# Patient Record
Sex: Female | Born: 1940 | Race: Black or African American | Hispanic: No | Smoking: Former smoker
Health system: Southern US, Community
[De-identification: ages and names within clinical notes are randomized; demographics above are authoritative.]

## PROBLEM LIST (undated history)

## (undated) DIAGNOSIS — R2681 Unsteadiness on feet: Secondary | ICD-10-CM

## (undated) DIAGNOSIS — E46 Unspecified protein-calorie malnutrition: Secondary | ICD-10-CM

## (undated) DIAGNOSIS — M8580 Other specified disorders of bone density and structure, unspecified site: Secondary | ICD-10-CM

## (undated) DIAGNOSIS — M1711 Unilateral primary osteoarthritis, right knee: Secondary | ICD-10-CM

## (undated) DIAGNOSIS — E119 Type 2 diabetes mellitus without complications: Secondary | ICD-10-CM

## (undated) DIAGNOSIS — Z87891 Personal history of nicotine dependence: Secondary | ICD-10-CM

## (undated) DIAGNOSIS — R4189 Other symptoms and signs involving cognitive functions and awareness: Secondary | ICD-10-CM

## (undated) DIAGNOSIS — F0390 Unspecified dementia without behavioral disturbance: Secondary | ICD-10-CM

## (undated) DIAGNOSIS — M1990 Unspecified osteoarthritis, unspecified site: Secondary | ICD-10-CM

## (undated) DIAGNOSIS — K5790 Diverticulosis of intestine, part unspecified, without perforation or abscess without bleeding: Secondary | ICD-10-CM

## (undated) DIAGNOSIS — E7800 Pure hypercholesterolemia, unspecified: Secondary | ICD-10-CM

## (undated) DIAGNOSIS — K219 Gastro-esophageal reflux disease without esophagitis: Secondary | ICD-10-CM

## (undated) HISTORY — DX: Other symptoms and signs involving cognitive functions and awareness: R41.89

## (undated) HISTORY — DX: Unsteadiness on feet: R26.81

## (undated) HISTORY — DX: Unspecified protein-calorie malnutrition: E46

## (undated) HISTORY — PX: OTHER SURGICAL HISTORY: SHX169

## (undated) HISTORY — DX: Gastro-esophageal reflux disease without esophagitis: K21.9

## (undated) HISTORY — DX: Type 2 diabetes mellitus without complications: E11.9

## (undated) HISTORY — DX: Unspecified osteoarthritis, unspecified site: M19.90

## (undated) HISTORY — DX: Unilateral primary osteoarthritis, right knee: M17.11

## (undated) HISTORY — DX: Personal history of nicotine dependence: Z87.891

## (undated) HISTORY — DX: Other specified disorders of bone density and structure, unspecified site: M85.80

## (undated) HISTORY — DX: Unspecified dementia, unspecified severity, without behavioral disturbance, psychotic disturbance, mood disturbance, and anxiety: F03.90

## (undated) HISTORY — DX: Pure hypercholesterolemia, unspecified: E78.00

## (undated) HISTORY — DX: Diverticulosis of intestine, part unspecified, without perforation or abscess without bleeding: K57.90

---

## 1998-02-03 ENCOUNTER — Ambulatory Visit (HOSPITAL_COMMUNITY): Admission: RE | Admit: 1998-02-03 | Discharge: 1998-02-03 | Payer: Self-pay | Admitting: Internal Medicine

## 1998-03-19 ENCOUNTER — Other Ambulatory Visit: Admission: RE | Admit: 1998-03-19 | Discharge: 1998-03-19 | Payer: Self-pay | Admitting: Internal Medicine

## 1998-05-07 ENCOUNTER — Ambulatory Visit (HOSPITAL_COMMUNITY): Admission: RE | Admit: 1998-05-07 | Discharge: 1998-05-07 | Payer: Self-pay | Admitting: Neurosurgery

## 1998-10-02 ENCOUNTER — Emergency Department (HOSPITAL_COMMUNITY): Admission: EM | Admit: 1998-10-02 | Discharge: 1998-10-02 | Payer: Self-pay | Admitting: Emergency Medicine

## 1998-11-16 ENCOUNTER — Emergency Department (HOSPITAL_COMMUNITY): Admission: EM | Admit: 1998-11-16 | Discharge: 1998-11-16 | Payer: Self-pay | Admitting: Emergency Medicine

## 1998-11-17 ENCOUNTER — Encounter: Payer: Self-pay | Admitting: *Deleted

## 1999-11-25 ENCOUNTER — Encounter: Admission: RE | Admit: 1999-11-25 | Discharge: 1999-11-25 | Payer: Self-pay | Admitting: *Deleted

## 1999-11-25 ENCOUNTER — Encounter: Payer: Self-pay | Admitting: Internal Medicine

## 2000-01-08 ENCOUNTER — Ambulatory Visit (HOSPITAL_COMMUNITY): Admission: RE | Admit: 2000-01-08 | Discharge: 2000-01-08 | Payer: Self-pay | Admitting: Internal Medicine

## 2000-01-08 ENCOUNTER — Encounter: Payer: Self-pay | Admitting: Internal Medicine

## 2002-05-25 ENCOUNTER — Encounter: Payer: Self-pay | Admitting: Neurosurgery

## 2002-05-25 ENCOUNTER — Ambulatory Visit (HOSPITAL_COMMUNITY): Admission: RE | Admit: 2002-05-25 | Discharge: 2002-05-25 | Payer: Self-pay | Admitting: Neurosurgery

## 2002-06-06 ENCOUNTER — Encounter: Admission: RE | Admit: 2002-06-06 | Discharge: 2002-09-04 | Payer: Self-pay | Admitting: Orthopedic Surgery

## 2002-09-05 ENCOUNTER — Encounter (HOSPITAL_COMMUNITY): Admission: RE | Admit: 2002-09-05 | Discharge: 2002-09-06 | Payer: Self-pay | Admitting: Orthopedic Surgery

## 2002-09-05 ENCOUNTER — Encounter: Admission: RE | Admit: 2002-09-05 | Discharge: 2002-12-04 | Payer: Self-pay | Admitting: Orthopedic Surgery

## 2002-10-11 ENCOUNTER — Encounter: Admission: RE | Admit: 2002-10-11 | Discharge: 2002-10-11 | Payer: Self-pay | Admitting: Internal Medicine

## 2002-10-11 ENCOUNTER — Encounter: Payer: Self-pay | Admitting: Internal Medicine

## 2003-12-13 ENCOUNTER — Emergency Department (HOSPITAL_COMMUNITY): Admission: AD | Admit: 2003-12-13 | Discharge: 2003-12-13 | Payer: Self-pay | Admitting: Family Medicine

## 2004-01-06 ENCOUNTER — Emergency Department (HOSPITAL_COMMUNITY): Admission: AD | Admit: 2004-01-06 | Discharge: 2004-01-06 | Payer: Self-pay | Admitting: Family Medicine

## 2005-03-10 ENCOUNTER — Emergency Department (HOSPITAL_COMMUNITY): Admission: EM | Admit: 2005-03-10 | Discharge: 2005-03-10 | Payer: Self-pay | Admitting: Emergency Medicine

## 2005-04-05 ENCOUNTER — Encounter: Admission: RE | Admit: 2005-04-05 | Discharge: 2005-04-05 | Payer: Self-pay | Admitting: Neurosurgery

## 2005-09-04 ENCOUNTER — Emergency Department (HOSPITAL_COMMUNITY): Admission: EM | Admit: 2005-09-04 | Discharge: 2005-09-04 | Payer: Self-pay | Admitting: Family Medicine

## 2006-06-22 ENCOUNTER — Encounter: Admission: RE | Admit: 2006-06-22 | Discharge: 2006-06-22 | Payer: Self-pay | Admitting: Orthopedic Surgery

## 2006-07-07 ENCOUNTER — Encounter: Admission: RE | Admit: 2006-07-07 | Discharge: 2006-07-07 | Payer: Self-pay | Admitting: Orthopedic Surgery

## 2006-08-09 ENCOUNTER — Ambulatory Visit (HOSPITAL_COMMUNITY): Admission: RE | Admit: 2006-08-09 | Discharge: 2006-08-10 | Payer: Self-pay | Admitting: Orthopedic Surgery

## 2006-09-14 ENCOUNTER — Encounter: Admission: RE | Admit: 2006-09-14 | Discharge: 2006-10-05 | Payer: Self-pay | Admitting: Orthopedic Surgery

## 2006-10-06 ENCOUNTER — Encounter: Admission: RE | Admit: 2006-10-06 | Discharge: 2006-11-02 | Payer: Self-pay | Admitting: Orthopedic Surgery

## 2006-10-07 ENCOUNTER — Encounter: Admission: RE | Admit: 2006-10-07 | Discharge: 2006-10-07 | Payer: Self-pay | Admitting: Orthopedic Surgery

## 2006-11-03 ENCOUNTER — Encounter: Admission: RE | Admit: 2006-11-03 | Discharge: 2007-01-02 | Payer: Self-pay | Admitting: Orthopedic Surgery

## 2007-01-17 ENCOUNTER — Ambulatory Visit (HOSPITAL_COMMUNITY): Admission: RE | Admit: 2007-01-17 | Discharge: 2007-01-18 | Payer: Self-pay | Admitting: Orthopedic Surgery

## 2007-02-16 ENCOUNTER — Encounter: Admission: RE | Admit: 2007-02-16 | Discharge: 2007-03-09 | Payer: Self-pay | Admitting: Orthopedic Surgery

## 2007-03-10 ENCOUNTER — Encounter: Admission: RE | Admit: 2007-03-10 | Discharge: 2007-03-30 | Payer: Self-pay | Admitting: Orthopedic Surgery

## 2007-05-08 ENCOUNTER — Emergency Department (HOSPITAL_COMMUNITY): Admission: EM | Admit: 2007-05-08 | Discharge: 2007-05-08 | Payer: Self-pay | Admitting: Family Medicine

## 2007-10-10 ENCOUNTER — Encounter: Admission: RE | Admit: 2007-10-10 | Discharge: 2007-10-10 | Payer: Self-pay | Admitting: Orthopedic Surgery

## 2007-11-01 ENCOUNTER — Encounter: Admission: RE | Admit: 2007-11-01 | Discharge: 2007-11-01 | Payer: Self-pay | Admitting: Orthopedic Surgery

## 2008-06-05 ENCOUNTER — Encounter: Admission: RE | Admit: 2008-06-05 | Discharge: 2008-06-05 | Payer: Self-pay | Admitting: Orthopedic Surgery

## 2008-06-12 ENCOUNTER — Ambulatory Visit (HOSPITAL_COMMUNITY): Admission: RE | Admit: 2008-06-12 | Discharge: 2008-06-12 | Payer: Self-pay | Admitting: Internal Medicine

## 2008-06-26 ENCOUNTER — Encounter: Admission: RE | Admit: 2008-06-26 | Discharge: 2008-07-24 | Payer: Self-pay | Admitting: Orthopedic Surgery

## 2009-04-23 IMAGING — CR DG WRIST COMPLETE 3+V*R*
2 series · 2 of 2 positions shown · non-contrast
Comparison: none

Addendum BeginsOriginal report by Dr. Jae-Yong Jerabkova.  Following addendum by Dr. Jae-Yong Jerabkova on 10/10/07.
 Comparison from 01/06/04 demonstrates degenerative changes between the capitellum and the scaphoid are similar. 

 Addendum Ends
CLINICAL DATA: 66 year old with right wrist pain and swelling. 
 RIGHT WRIST - 4 VIEW:

[view not recorded (1 of 2)]
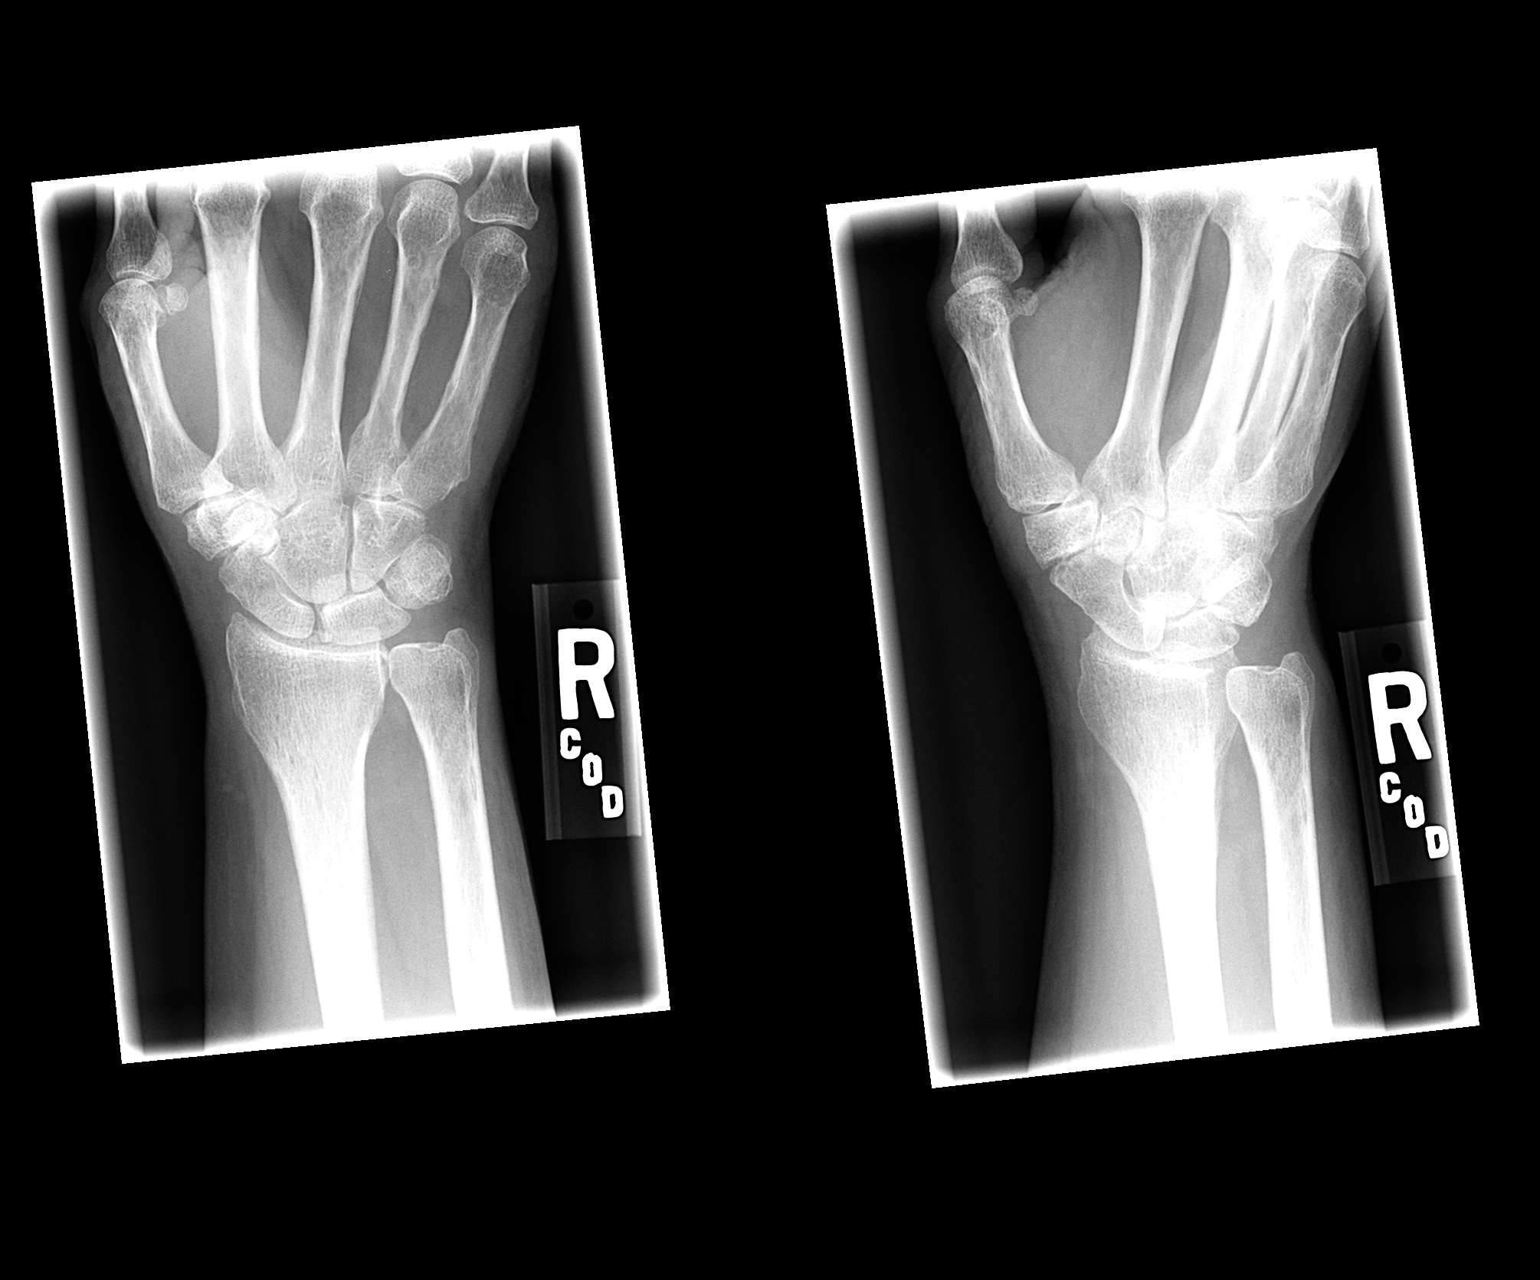

[view not recorded (2 of 2)]
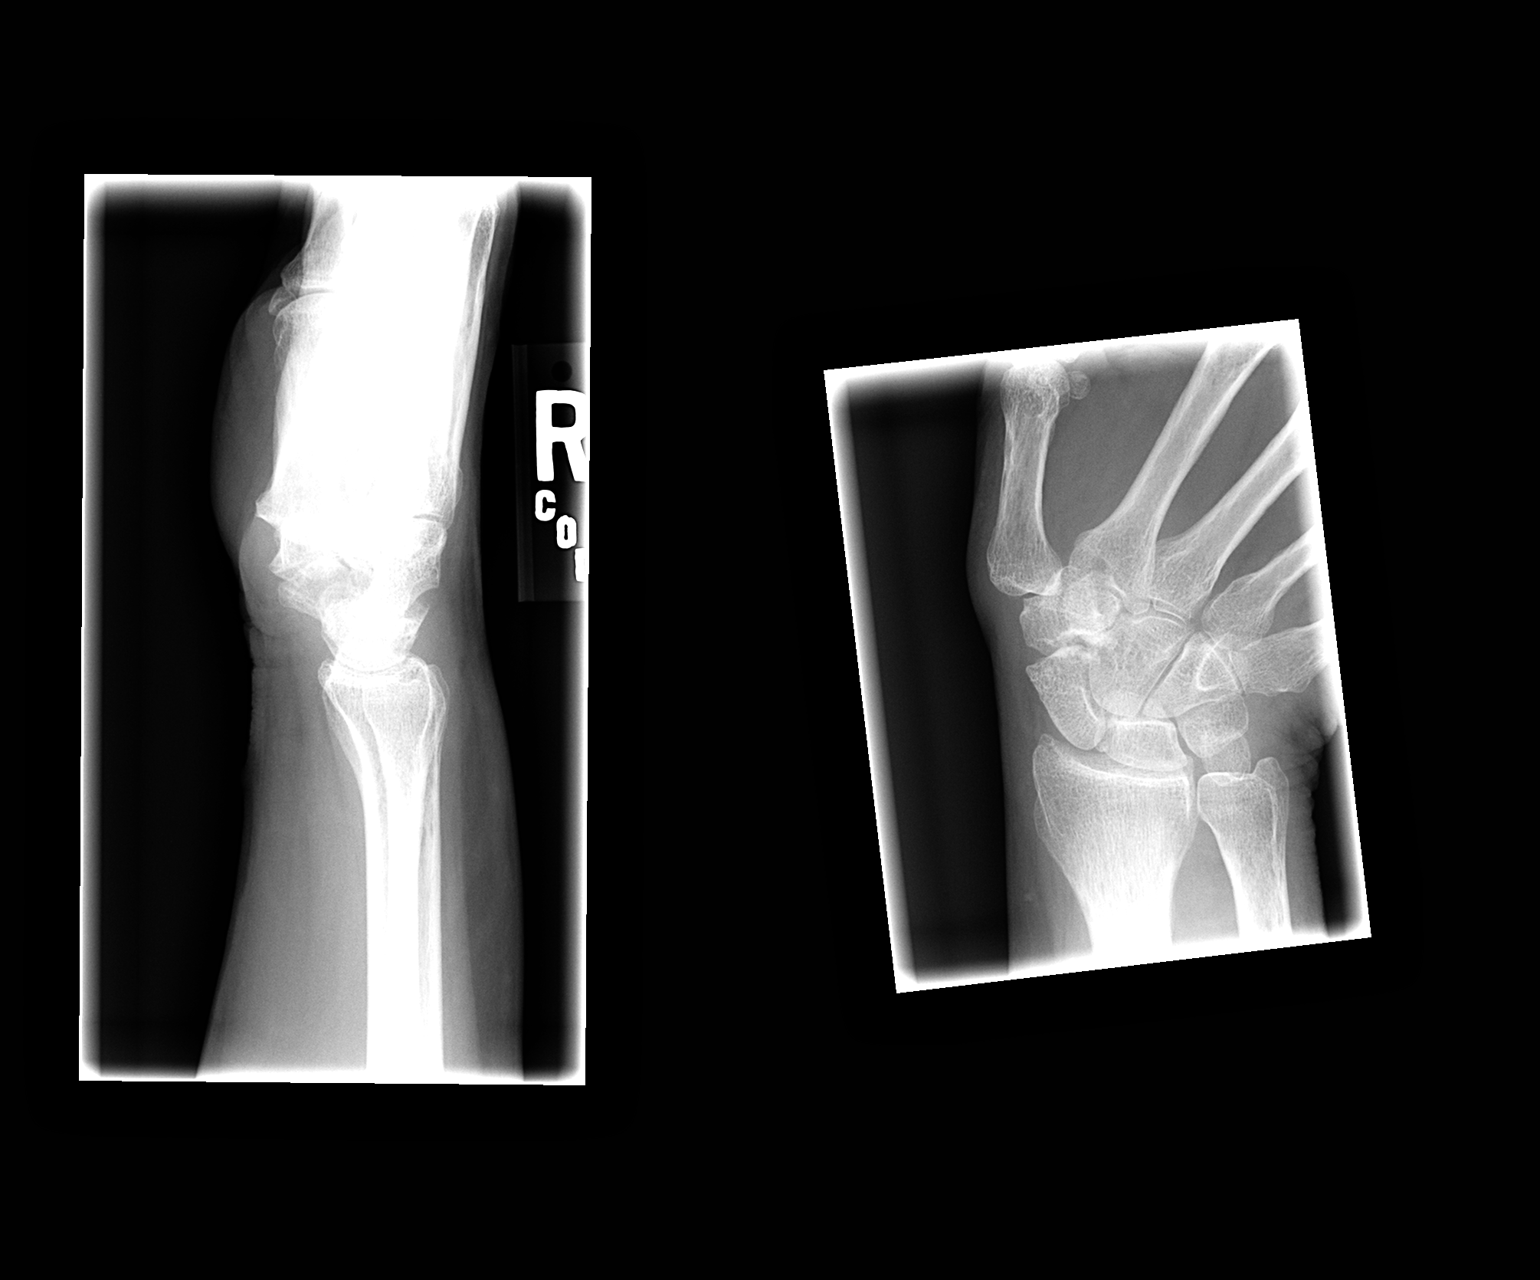

[2 of 2 positions shown; findings below may reference images not displayed]

FINDINGS: Four views of the right wrist are negative for acute fracture or dislocation.  There are degenerative changes between the scaphoid and capitellum.  Alignment of the wrist is normal.
IMPRESSION: Mild degenerative changes of the right wrist without acute bone abnormalities.

## 2009-06-24 ENCOUNTER — Encounter (INDEPENDENT_AMBULATORY_CARE_PROVIDER_SITE_OTHER): Payer: Self-pay | Admitting: *Deleted

## 2009-10-07 ENCOUNTER — Ambulatory Visit (HOSPITAL_COMMUNITY): Admission: RE | Admit: 2009-10-07 | Discharge: 2009-10-07 | Payer: Self-pay | Admitting: Internal Medicine

## 2009-10-31 ENCOUNTER — Emergency Department (HOSPITAL_COMMUNITY): Admission: EM | Admit: 2009-10-31 | Discharge: 2009-10-31 | Payer: Self-pay | Admitting: Emergency Medicine

## 2010-11-16 ENCOUNTER — Ambulatory Visit (HOSPITAL_COMMUNITY)
Admission: RE | Admit: 2010-11-16 | Discharge: 2010-11-16 | Payer: Self-pay | Source: Home / Self Care | Attending: Internal Medicine | Admitting: Internal Medicine

## 2011-02-03 ENCOUNTER — Inpatient Hospital Stay (INDEPENDENT_AMBULATORY_CARE_PROVIDER_SITE_OTHER)
Admission: RE | Admit: 2011-02-03 | Discharge: 2011-02-03 | Disposition: A | Discharge status: Home / Self Care | Payer: PRIVATE HEALTH INSURANCE | Source: Ambulatory Visit | Attending: Family Medicine | Admitting: Family Medicine

## 2011-02-03 DIAGNOSIS — IMO0002 Reserved for concepts with insufficient information to code with codable children: Secondary | ICD-10-CM

## 2011-04-15 ENCOUNTER — Encounter: Payer: Self-pay | Admitting: *Deleted

## 2011-04-15 ENCOUNTER — Other Ambulatory Visit: Payer: Self-pay | Admitting: Internal Medicine

## 2011-04-15 DIAGNOSIS — R42 Dizziness and giddiness: Secondary | ICD-10-CM

## 2011-04-15 DIAGNOSIS — E119 Type 2 diabetes mellitus without complications: Secondary | ICD-10-CM | POA: Insufficient documentation

## 2011-04-21 ENCOUNTER — Other Ambulatory Visit: Payer: PRIVATE HEALTH INSURANCE

## 2011-04-23 NOTE — Op Note (Addendum)
NAME:  Anita Medina, Anita Medina NO.:  [id number]   MEDICAL RECORD NO.:  [id number]          PATIENT TYPE:  INP   LOCATION:  NA                           FACILITY:  [facility]   PHYSICIAN:  Myrtie Neither, MD      DATE OF BIRTH:  1941/04/16   DATE OF PROCEDURE:  08/09/2006  DATE OF DISCHARGE:                                 OPERATIVE REPORT   PREOPERATIVE DIAGNOSIS:  Tension syndrome, right shoulder, with rotator cuff  tear.   POSTOPERATIVE DIAGNOSIS:  Tension syndrome, right shoulder, with rotator  cuff tear.   ANESTHESIA:  General.   PROCEDURES:  1. Arthroscopic acromioplasty and synovectomy, right shoulder      decompression.  2. Mini-open rotator cuff repair, right shoulder.   PROCEDURE IN DETAIL:  The patient was taken to the operating room and given  adequate preop medications, general endotracheal anesthesia, and intubated.  The patient was placed in barber's chair position.  The right shoulder was  prepped with DuraPrep.  The right shoulder was draped in sterile manner.  A  one-half inch puncture wound was made posterior to the shoulder.  ____ QA                                                           MARKER: 62____ rod was placed from posterior to anterior.  Anterior     incision for ____  QA MARKER: 65 ____ made.  synovial shaving.  Inspection of the joint revealed a split tear rotator  cuff posteriorly.  Chondromalacia changes at the undersurface of the  subacromial surface and chronic synovitis involving the subacromial bursal  sac.  With synovial shaver, complete synovectomy was done.  Debridement of  the area.  Decompression of the subacromial space with dissection of the  coracohumeral ligament.  Dissection was done.  After adequate decompression,  acromioplasty was then done with the use of a bur.  Irrigation and  debridement was then done followed by mini-open repair of the split rotator  cuff tear posteriorly.  This was done with the use of 0 Tycron  suture.  After good approximation of the tear, irrigation was then done.  Wound  closure was then done with 0 Vicryl for the fascia, 2-0 for the  subcutaneous.  Then 10 cc of  0.25% Marcaine was injected into the joint.  Compressive dressings were applied and abduction showed a mobilizing supply.  The patient tolerated the procedure quite well.  To the recovery room in  stable and satisfactory condition.   The patient is being admitted to a 23-hour observation for pain control.  Patient will be discharged in the a.m. on Percocet 1-2 q.4h. p.r.n. pain.  Continue shoulder mobilization.  Ice packs.  Return to the office in one  week.      Myrtie Neither, MD  Electronically Signed     AC/MEDQ  D:  08/09/2006  T:  08/09/2006  Job:  [id number]

## 2011-04-23 NOTE — Op Note (Signed)
NAME:  Anita Medina, Anita Medina NO.:  [id number]   MEDICAL RECORD NO.:  [id number]          PATIENT TYPE:  AMB   LOCATION:  SDS                          FACILITY:  [facility]   PHYSICIAN:  Myrtie Neither, MD      DATE OF BIRTH:  06/05/1941   DATE OF PROCEDURE:  01/17/2007  DATE OF DISCHARGE:                               OPERATIVE REPORT   PREOPERATIVE DIAGNOSES:  1. Impingement syndrome, left shoulder.  2. Rotator cuff tear, left shoulder.   POSTOPERATIVE DIAGNOSES:  1. Impingement syndrome, left shoulder.  2. Rotator cuff tear, left shoulder.   SURGEON:  Myrtie Neither, MD   ANESTHESIA:  General.   PROCEDURE:  1. Arthroscopic acromioplasty and decompression, synovectomy, left      shoulder.  2. Mini-incision rotator cuff repair with a suture anchor.   DESCRIPTION OF PROCEDURE:  The patient was taken to the operating room  after giving adequate preop medications and given anesthesia and  intubated.  The patient was placed in a barber-chair position.  The left  shoulder and upper extremity were prepped with DuraPrep and draped in a  sterile manner.  A 1/2-inch puncture wound was made posterior.  Swisher  rod was placed from posterior to anterior.  Anterior incision was made.  Lateral incision was then made.  Inspection of the joint revealed  hypertrophic subacromial bursal sac with chondromalacia changes of the  subacromial surface, overriding of the acromion and tear of the rotator  cuff.  Complete synovectomy and decompression were done with a synovial  shaver.  Next, the acromioplasty was then done.  After adequate  acromioplasty was done, further inspection did not reveal any other  osteophytes or overhanging bone.  Next, a mini-incision was made  laterally going through skin and subcutaneous tissue.  Deltoid was split  and the arm was externally rotated, bringing the cuff tear into  visibility.  The supraspinatus was noted to be torn with frayed edges.  Anchor  suture was then put in place followed by using the sutures to the  anchor, closing up the gap in the rotator cuff.  After adequate repair,  further inspection did not reveal any other defects.  Copious irrigation  was then done followed by wound closure, 2-0 Vicryl for deltoid and  subcutaneous, skin staples for the skin.  A compressive dressing was  applied followed by use of a shoulder abduction brace.  The patient  tolerated procedure quite well and went to the recovery room in stable  and satisfactory condition.  The patient is being kept for 23-hour  observation for  pain control and will be discharged on Percocet one to two q.6 h. p.r.n.  for pain, continued use of the abduction brace and be discharged in the  a.m. to return to office in 1 week.  The patient will be discharged in  the stable and satisfactory condition.      Myrtie Neither, MD  Electronically Signed     AC/MEDQ  D:  01/17/2007  T:  01/17/2007  Job:  [id number]

## 2011-04-28 ENCOUNTER — Other Ambulatory Visit: Payer: PRIVATE HEALTH INSURANCE

## 2011-06-30 ENCOUNTER — Encounter: Payer: Self-pay | Admitting: Podiatrist

## 2013-11-21 ENCOUNTER — Ambulatory Visit: Payer: Self-pay | Admitting: Podiatrist

## 2014-09-11 ENCOUNTER — Other Ambulatory Visit: Payer: Self-pay | Admitting: Internal Medicine

## 2014-09-11 DIAGNOSIS — Z1231 Encounter for screening mammogram for malignant neoplasm of breast: Secondary | ICD-10-CM

## 2014-09-11 DIAGNOSIS — Z780 Asymptomatic menopausal state: Secondary | ICD-10-CM

## 2014-09-24 ENCOUNTER — Ambulatory Visit (HOSPITAL_COMMUNITY)
Admission: RE | Admit: 2014-09-24 | Discharge: 2014-09-24 | Disposition: A | Discharge status: Home / Self Care | Payer: PRIVATE HEALTH INSURANCE | Source: Ambulatory Visit | Attending: Internal Medicine | Admitting: Internal Medicine

## 2014-09-24 DIAGNOSIS — Z13820 Encounter for screening for osteoporosis: Secondary | ICD-10-CM | POA: Insufficient documentation

## 2014-09-24 DIAGNOSIS — Z780 Asymptomatic menopausal state: Secondary | ICD-10-CM | POA: Diagnosis not present

## 2014-09-24 DIAGNOSIS — Z1231 Encounter for screening mammogram for malignant neoplasm of breast: Secondary | ICD-10-CM | POA: Diagnosis not present

## 2015-01-15 DIAGNOSIS — I10 Essential (primary) hypertension: Secondary | ICD-10-CM | POA: Diagnosis not present

## 2015-01-15 DIAGNOSIS — E119 Type 2 diabetes mellitus without complications: Secondary | ICD-10-CM | POA: Diagnosis not present

## 2015-01-15 DIAGNOSIS — M150 Primary generalized (osteo)arthritis: Secondary | ICD-10-CM | POA: Diagnosis not present

## 2015-01-15 DIAGNOSIS — M810 Age-related osteoporosis without current pathological fracture: Secondary | ICD-10-CM | POA: Diagnosis not present

## 2015-05-14 DIAGNOSIS — M150 Primary generalized (osteo)arthritis: Secondary | ICD-10-CM | POA: Diagnosis not present

## 2015-05-14 DIAGNOSIS — M810 Age-related osteoporosis without current pathological fracture: Secondary | ICD-10-CM | POA: Diagnosis not present

## 2015-05-14 DIAGNOSIS — I10 Essential (primary) hypertension: Secondary | ICD-10-CM | POA: Diagnosis not present

## 2015-05-14 DIAGNOSIS — E119 Type 2 diabetes mellitus without complications: Secondary | ICD-10-CM | POA: Diagnosis not present

## 2015-09-25 ENCOUNTER — Encounter (HOSPITAL_COMMUNITY): Payer: Self-pay | Admitting: Emergency Medicine

## 2015-09-25 ENCOUNTER — Emergency Department (INDEPENDENT_AMBULATORY_CARE_PROVIDER_SITE_OTHER)
Admission: EM | Admit: 2015-09-25 | Discharge: 2015-09-25 | Disposition: A | Discharge status: Home / Self Care | Payer: Medicare Other | Source: Home / Self Care | Attending: Family Medicine | Admitting: Family Medicine

## 2015-09-25 DIAGNOSIS — K2100 Gastro-esophageal reflux disease with esophagitis, without bleeding: Secondary | ICD-10-CM

## 2015-09-25 DIAGNOSIS — J029 Acute pharyngitis, unspecified: Secondary | ICD-10-CM

## 2015-09-25 LAB — POCT RAPID STREP A: Streptococcus, Group A Screen (Direct): NEGATIVE

## 2015-09-25 MED ORDER — GI COCKTAIL ~~LOC~~
ORAL | Status: AC
  Filled 2015-09-25: qty 30

## 2015-09-25 MED ORDER — AMOXICILLIN 500 MG PO CAPS: 500.0000 mg | ORAL_CAPSULE | Freq: Two times a day (BID) | ORAL | Status: DC

## 2015-09-25 MED ORDER — GI COCKTAIL ~~LOC~~
30.0000 mL | Freq: Once | ORAL | Status: AC
  Administered 2015-09-25: 30 mL via ORAL

## 2015-09-25 MED ORDER — ESOMEPRAZOLE MAGNESIUM 20 MG PO PACK: 20.0000 mg | PACK | Freq: Every day | ORAL | Status: DC

## 2015-09-25 MED ORDER — FLUCONAZOLE 150 MG PO TABS: 150.0000 mg | ORAL_TABLET | Freq: Every day | ORAL | Status: DC

## 2015-09-25 NOTE — ED Notes (Signed)
C/o sore throat States she has a productive cough with thick mucous.   States chest is swollen and painful Has been sneezing Denies any problems with abd area Throat lozenges and aleve used as tx

## 2015-09-25 NOTE — ED Provider Notes (Signed)
CSN: [id number]     Arrival date & time 09/25/15  1307 History   First MD Initiated Contact with Patient 09/25/15 1334     Chief Complaint  Patient presents with  . Sore Throat   (Consider location/radiation/quality/duration/timing/severity/associated sxs/prior Treatment) HPI   Sore throat started 3 weeks ago. Associated w/ weakness, cough and lack of appetite and earache, runny nose. Resolved after 1 week then returned 5 days ago. Sharp pains in L upper chest -  Nearly resolved. worse w/ coughing.  Robitussin thorat lozenges and aleve No Fevers but gettign worse Stopped Nexium several months ago     History reviewed. No pertinent past medical history. History reviewed. No pertinent past surgical history. Family History  Problem Relation Age of Onset  . Cancer Other   . Diabetes Other   . Hypertension Other    Social History  Substance Use Topics  . Smoking status: Never Smoker   . Smokeless tobacco: None  . Alcohol Use: No   OB History    No data available     Review of Systems Per HPI with all other pertinent systems negative.   Allergies  Aspirin; Clarithromycin; and Doxycycline  Home Medications   Prior to Admission medications   Medication Sig Start Date End Date Taking? Authorizing Provider  amoxicillin (AMOXIL) 500 MG capsule Take 1 capsule (500 mg total) by mouth 2 (two) times daily. 09/25/15   Ozella Rocksavid J Elouise Divelbiss, MD  b complex vitamins tablet Take 1 tablet by mouth daily.      Historical Provider, MD  Calcium Carbonate-Vitamin D (CALCIUM + D PO) Take by mouth.      Historical Provider, MD  esomeprazole (NEXIUM) 20 MG packet Take 20 mg by mouth daily before breakfast. 09/25/15   Ozella Rocksavid J Lovett Coffin, MD  fish oil-omega-3 fatty acids 1000 MG capsule Take 2 g by mouth daily.      Historical Provider, MD  fluconazole (DIFLUCAN) 150 MG tablet Take 1 tablet (150 mg total) by mouth daily. Repeat dose in 3 days 09/25/15   Ozella Rocksavid J Tiawanna Luchsinger, MD  metFORMIN (GLUCOPHAGE-XR)  750 MG 24 hr tablet Take 750 mg by mouth daily with breakfast.      Historical Provider, MD   Meds Ordered and Administered this Visit   Medications  gi cocktail (Maalox,Lidocaine,Donnatal) (30 mLs Oral Given 09/25/15 1359)    BP 136/68 mmHg  Pulse 74  Temp(Src) 98.7 F (37.1 C) (Oral)  Resp 20  SpO2 100%  LMP 04/15/1991 No data found.   Physical Exam Physical Exam  Constitutional: oriented to person, place, and time. appears well-developed and well-nourished. No distress.  HENT:  Tonsils 0-1+ w/o exudate. Pharyngeal injection Head: Normocephalic and atraumatic.  Eyes: EOMI. PERRL.  Neck: Normal range of motion.  Cardiovascular: RRR, no m/r/g, 2+ distal pulses,  Pulmonary/Chest: Effort normal and breath sounds normal. No respiratory distress.  Abdominal: Soft. Bowel sounds are normal. NonTTP, no distension.  Musculoskeletal: Normal range of motion. Non ttp, no effusion.  Neurological: alert and oriented to person, place, and time.  Skin: Skin is warm. No rash noted. non diaphoretic.  Psychiatric: normal mood and affect. behavior is normal. Judgment and thought content normal.   ED Course  Procedures (including critical care time)  Labs Review Labs Reviewed  POCT RAPID STREP A    Imaging Review No results found.   Visual Acuity Review  Right Eye Distance:   Left Eye Distance:   Bilateral Distance:    Right Eye Near:   Left  Eye Near:    Bilateral Near:         MDM   1. Sore throat   2. Gastroesophageal reflux disease with esophagitis    Rapid strep neg. But cannot exclude given how pt likelty had viral infection which resolved but then developed a second infection which sounds more like strep.  No CAP.  Suspect a large amount of pts symptoms are due to reflux esophagitis. GI cocktail in clinic and restart Nexium Start Amox if not imrpoving   Ozella Rocks, MD 09/25/15 1408

## 2015-09-25 NOTE — Discharge Instructions (Signed)
Your symptoms are likely from reflux or increased acid in your stomach and throat.  Please restart the Nexium. If that is too expensive you can take prilosec Please start the antibiotic if your symptoms do not improve.

## 2015-09-27 LAB — CULTURE, GROUP A STREP: STREP A CULTURE: NEGATIVE

## 2015-09-30 NOTE — ED Notes (Signed)
Final report of strep testing negative  

## 2015-10-20 DIAGNOSIS — M810 Age-related osteoporosis without current pathological fracture: Secondary | ICD-10-CM | POA: Diagnosis not present

## 2015-10-20 DIAGNOSIS — M150 Primary generalized (osteo)arthritis: Secondary | ICD-10-CM | POA: Diagnosis not present

## 2015-10-20 DIAGNOSIS — E119 Type 2 diabetes mellitus without complications: Secondary | ICD-10-CM | POA: Diagnosis not present

## 2015-10-20 DIAGNOSIS — I10 Essential (primary) hypertension: Secondary | ICD-10-CM | POA: Diagnosis not present

## 2015-11-05 DIAGNOSIS — M150 Primary generalized (osteo)arthritis: Secondary | ICD-10-CM | POA: Diagnosis not present

## 2015-11-05 DIAGNOSIS — I10 Essential (primary) hypertension: Secondary | ICD-10-CM | POA: Diagnosis not present

## 2015-11-05 DIAGNOSIS — E119 Type 2 diabetes mellitus without complications: Secondary | ICD-10-CM | POA: Diagnosis not present

## 2015-11-05 DIAGNOSIS — M810 Age-related osteoporosis without current pathological fracture: Secondary | ICD-10-CM | POA: Diagnosis not present

## 2015-11-05 DIAGNOSIS — H8113 Benign paroxysmal vertigo, bilateral: Secondary | ICD-10-CM | POA: Diagnosis not present

## 2016-01-29 DIAGNOSIS — E559 Vitamin D deficiency, unspecified: Secondary | ICD-10-CM | POA: Diagnosis not present

## 2016-01-29 DIAGNOSIS — E109 Type 1 diabetes mellitus without complications: Secondary | ICD-10-CM | POA: Diagnosis not present

## 2016-01-29 DIAGNOSIS — E039 Hypothyroidism, unspecified: Secondary | ICD-10-CM | POA: Diagnosis not present

## 2016-01-29 DIAGNOSIS — Z0001 Encounter for general adult medical examination with abnormal findings: Secondary | ICD-10-CM | POA: Diagnosis not present

## 2016-03-09 DIAGNOSIS — I10 Essential (primary) hypertension: Secondary | ICD-10-CM | POA: Diagnosis not present

## 2016-03-09 DIAGNOSIS — M810 Age-related osteoporosis without current pathological fracture: Secondary | ICD-10-CM | POA: Diagnosis not present

## 2016-03-09 DIAGNOSIS — M150 Primary generalized (osteo)arthritis: Secondary | ICD-10-CM | POA: Diagnosis not present

## 2016-03-09 DIAGNOSIS — E119 Type 2 diabetes mellitus without complications: Secondary | ICD-10-CM | POA: Diagnosis not present

## 2016-07-13 DIAGNOSIS — M150 Primary generalized (osteo)arthritis: Secondary | ICD-10-CM | POA: Diagnosis not present

## 2016-07-13 DIAGNOSIS — E119 Type 2 diabetes mellitus without complications: Secondary | ICD-10-CM | POA: Diagnosis not present

## 2016-07-13 DIAGNOSIS — I10 Essential (primary) hypertension: Secondary | ICD-10-CM | POA: Diagnosis not present

## 2016-07-13 DIAGNOSIS — M810 Age-related osteoporosis without current pathological fracture: Secondary | ICD-10-CM | POA: Diagnosis not present

## 2016-08-13 DIAGNOSIS — H40023 Open angle with borderline findings, high risk, bilateral: Secondary | ICD-10-CM | POA: Diagnosis not present

## 2016-08-13 DIAGNOSIS — H25013 Cortical age-related cataract, bilateral: Secondary | ICD-10-CM | POA: Diagnosis not present

## 2016-08-13 DIAGNOSIS — E119 Type 2 diabetes mellitus without complications: Secondary | ICD-10-CM | POA: Diagnosis not present

## 2016-08-13 DIAGNOSIS — H2513 Age-related nuclear cataract, bilateral: Secondary | ICD-10-CM | POA: Diagnosis not present

## 2016-11-10 DIAGNOSIS — M810 Age-related osteoporosis without current pathological fracture: Secondary | ICD-10-CM | POA: Diagnosis not present

## 2016-11-10 DIAGNOSIS — M150 Primary generalized (osteo)arthritis: Secondary | ICD-10-CM | POA: Diagnosis not present

## 2016-11-10 DIAGNOSIS — E119 Type 2 diabetes mellitus without complications: Secondary | ICD-10-CM | POA: Diagnosis not present

## 2016-11-10 DIAGNOSIS — I10 Essential (primary) hypertension: Secondary | ICD-10-CM | POA: Diagnosis not present

## 2017-04-12 ENCOUNTER — Other Ambulatory Visit: Payer: Self-pay | Admitting: Internal Medicine

## 2017-04-12 DIAGNOSIS — Z1231 Encounter for screening mammogram for malignant neoplasm of breast: Secondary | ICD-10-CM

## 2017-04-12 DIAGNOSIS — M8580 Other specified disorders of bone density and structure, unspecified site: Secondary | ICD-10-CM

## 2017-05-06 ENCOUNTER — Ambulatory Visit
Admission: RE | Admit: 2017-05-06 | Discharge: 2017-05-06 | Disposition: A | Discharge status: Home / Self Care | Payer: Medicare Other | Source: Ambulatory Visit | Attending: Internal Medicine | Admitting: Internal Medicine

## 2017-05-06 ENCOUNTER — Ambulatory Visit: Payer: Medicare Other

## 2017-05-06 DIAGNOSIS — Z1231 Encounter for screening mammogram for malignant neoplasm of breast: Secondary | ICD-10-CM

## 2017-05-17 ENCOUNTER — Other Ambulatory Visit: Payer: Medicare Other

## 2017-08-22 ENCOUNTER — Inpatient Hospital Stay: Admission: RE | Admit: 2017-08-22 | Payer: Medicare Other | Source: Ambulatory Visit

## 2019-01-06 ENCOUNTER — Other Ambulatory Visit: Payer: Self-pay

## 2019-01-06 ENCOUNTER — Encounter (HOSPITAL_COMMUNITY): Payer: Self-pay | Admitting: Emergency Medicine

## 2019-01-06 ENCOUNTER — Emergency Department (HOSPITAL_COMMUNITY)
Admission: EM | Admit: 2019-01-06 | Discharge: 2019-01-07 | Disposition: A | Discharge status: Home / Self Care | Payer: Medicare Other | Attending: Emergency Medicine | Admitting: Emergency Medicine

## 2019-01-06 DIAGNOSIS — Z7984 Long term (current) use of oral hypoglycemic drugs: Secondary | ICD-10-CM | POA: Insufficient documentation

## 2019-01-06 DIAGNOSIS — Z79899 Other long term (current) drug therapy: Secondary | ICD-10-CM | POA: Insufficient documentation

## 2019-01-06 DIAGNOSIS — J029 Acute pharyngitis, unspecified: Secondary | ICD-10-CM | POA: Insufficient documentation

## 2019-01-06 DIAGNOSIS — E119 Type 2 diabetes mellitus without complications: Secondary | ICD-10-CM | POA: Insufficient documentation

## 2019-01-06 NOTE — ED Triage Notes (Signed)
Pt reports sore throat that has been ongoing for the last 3 days.

## 2019-01-07 DIAGNOSIS — J029 Acute pharyngitis, unspecified: Secondary | ICD-10-CM | POA: Diagnosis not present

## 2019-01-07 LAB — GROUP A STREP BY PCR: GROUP A STREP BY PCR: NOT DETECTED

## 2019-01-07 NOTE — ED Notes (Signed)
PT DISCHARGED. INSTRUCTIONS GIVEN. AAOX4. PT IN NO APPARENT DISTRESS OR PAIN. THE OPPORTUNITY TO ASK QUESTIONS WAS PROVIDED. 

## 2019-01-07 NOTE — Discharge Instructions (Addendum)
You can use sore throat lozenges.  Recheck if you get a fever or you have difficulty swallowing or breathing.  Otherwise call [facility] to have a specialist check your throat. Take omeprazole/prilosec OTC daily until you see the ENT.

## 2019-01-07 NOTE — ED Provider Notes (Signed)
[facility]-EMERGENCY DEPT Provider Note   CSN: [id number] Arrival date & time: 01/06/19  2302  Time seen 12:40 AM   History   Chief Complaint Chief Complaint  Patient presents with  . Sore Throat    HPI Anita Medina is a 78 y.o. female.  HPI patient states she has had a sore throat off and on for the past 3 days.  She denies fevers, she denies feeling like food gets stuck.  She is able to swallow and breathe without difficulty.  She states she has been having sore throats off and on for the past year and it last 1 to 2 weeks.  She has never discussed this with her primary care doctor or been evaluated by ENT.  She states her friends made her come in tonight to be evaluated.  She denies cough, rhinorrhea, sneezing now although she did have it a few days ago, postnasal drip, nausea, vomiting, or diarrhea.  She has not been around anybody else who is sick.  Patient denies smoking.  She states her voice is a little bit hoarse the past 3 days.  Patient states she is currently on no medications although she used to be on medications for diabetes.  She states her CBGs have been good.  History reviewed. No pertinent past medical history.  Patient Active Problem List   Diagnosis Date Noted  . Gastroesophagitis 04/15/2011  . Diabetes mellitus 04/15/2011    History reviewed. No pertinent surgical history.   OB History   No obstetric history on file.      Home Medications    Prior to Admission medications   Medication Sig Start Date End Date Taking? Authorizing Provider  amoxicillin (AMOXIL) 500 MG capsule Take 1 capsule (500 mg total) by mouth 2 (two) times daily. 09/25/15   Ozella RocksMerrell, David J, MD  b complex vitamins tablet Take 1 tablet by mouth daily.      [provider]  Calcium Carbonate-Vitamin D (CALCIUM + D PO) Take by mouth.      [provider]  esomeprazole (NEXIUM) 20 MG packet Take 20 mg by mouth daily before breakfast. 09/25/15    Ozella RocksMerrell, David J, MD  fish oil-omega-3 fatty acids 1000 MG capsule Take 2 g by mouth daily.      [provider]  fluconazole (DIFLUCAN) 150 MG tablet Take 1 tablet (150 mg total) by mouth daily. Repeat dose in 3 days 09/25/15   Ozella RocksMerrell, David J, MD  metFORMIN (GLUCOPHAGE-XR) 750 MG 24 hr tablet Take 750 mg by mouth daily with breakfast.      [provider]    Family History Family History  Problem Relation Age of Onset  . Cancer Other   . Diabetes Other   . Hypertension Other     Social History Social History   Tobacco Use  . Smoking status: Never Smoker  . Smokeless tobacco: Never Used  Substance Use Topics  . Alcohol use: No  . Drug use: No     Allergies   Aspirin; Clarithromycin; and Doxycycline   Review of Systems Review of Systems  All other systems reviewed and are negative.    Physical Exam Updated Vital Signs BP (!) 161/86 (BP Location: Left Arm)   Pulse 85   Temp 98.8 F (37.1 C) (Oral)   Resp 17   Ht 5\' 5"  (1.651 m)   Wt 72 kg   LMP 04/15/1991   SpO2 99%   BMI 26.41 kg/m  Physical Exam Constitutional:      Appearance: Normal appearance. She is normal weight.  HENT:     Head: Normocephalic and atraumatic.     Right Ear: External ear normal.     Left Ear: External ear normal.     Nose: Nose normal.     Mouth/Throat:     Mouth: Mucous membranes are moist.     Palate: No mass and lesions.     Pharynx: Uvula midline. No pharyngeal swelling, oropharyngeal exudate or posterior oropharyngeal erythema.     Tonsils: No tonsillar exudate or tonsillar abscesses.     Comments: Patient's voice is mildly hoarse Eyes:     Extraocular Movements: Extraocular movements intact.     Conjunctiva/sclera: Conjunctivae normal.     Pupils: Pupils are equal, round, and reactive to light.  Cardiovascular:     Rate and Rhythm: Normal rate.  Pulmonary:     Effort: Pulmonary effort is normal. No respiratory distress.  Musculoskeletal: Normal  range of motion.  Lymphadenopathy:     Cervical: No cervical adenopathy.  Skin:    General: Skin is warm and dry.     Findings: No erythema.  Neurological:     General: No focal deficit present.     Mental Status: She is alert and oriented to person, place, and time.     Cranial Nerves: No cranial nerve deficit.  Psychiatric:        Mood and Affect: Mood normal.        Behavior: Behavior normal.        Thought Content: Thought content normal.      ED Treatments / Results  Labs (all labs ordered are listed, but only abnormal results are displayed) Results for orders placed or performed during the hospital encounter of 01/06/19  Group A Strep by PCR  Result Value Ref Range   Group A Strep by PCR NOT DETECTED NOT DETECTED   Laboratory interpretation all normal except     EKG None  Radiology No results found.  Procedures Procedures (including critical care time)  Medications Ordered in ED Medications - No data to display   Initial Impression / Assessment and Plan / ED Course  I have reviewed the triage vital signs and the nursing notes.  Pertinent labs & imaging results that were available during my care of the patient were reviewed by me and considered in my medical decision making (see chart for details).     Strep testing was done although patient's throat looks fine.  We discussed if it is negative she should take a PPI in case she is having some reflux that is causing her to have a sore throat.  We also discussed she should follow-up with the ears nose and throat specialist for a visualization of her vocal cords.  She should return to the ER if she gets a fever, or has difficulty breathing or is unable to swallow.  Final Clinical Impressions(s) / ED Diagnoses   Final diagnoses:  Sore throat    ED Discharge Orders    None    OTC omeprazole  Plan discharge  Devoria AlbeIva Luvena Wentling, MD, Concha PyoFACEP    Kadynce Bonds, MD 01/07/19 [phone]

## 2019-01-08 ENCOUNTER — Ambulatory Visit (INDEPENDENT_AMBULATORY_CARE_PROVIDER_SITE_OTHER): Payer: Medicare Other | Admitting: Family Medicine

## 2019-01-08 ENCOUNTER — Encounter: Payer: Self-pay | Admitting: Family Medicine

## 2019-01-08 VITALS — BP 110/60 | HR 79 | Temp 98.4°F | Resp 12 | Ht 65.0 in | Wt 165.2 lb

## 2019-01-08 DIAGNOSIS — J312 Chronic pharyngitis: Secondary | ICD-10-CM | POA: Diagnosis not present

## 2019-01-08 DIAGNOSIS — K219 Gastro-esophageal reflux disease without esophagitis: Secondary | ICD-10-CM | POA: Diagnosis not present

## 2019-01-08 MED ORDER — ESOMEPRAZOLE MAGNESIUM 20 MG PO CPDR: 20.0000 mg | DELAYED_RELEASE_CAPSULE | Freq: Every day | ORAL | 1 refills | Status: DC

## 2019-01-08 NOTE — Patient Instructions (Addendum)
A few things to remember from today's visit:   Chronic sore throat - Plan: Ambulatory referral to ENT  Gastroesophageal reflux disease, esophagitis presence not specified - Plan: esomeprazole (NEXIUM) 20 MG capsule   Food Choices for Gastroesophageal Reflux Disease, Adult When you have gastroesophageal reflux disease (GERD), the foods you eat and your eating habits are very important. Choosing the right foods can help ease your discomfort. Think about working with a nutrition specialist (dietitian) to help you make good choices. What are tips for following this plan?  Meals  Choose healthy foods that are low in fat, such as fruits, vegetables, whole grains, low-fat dairy products, and lean meat, fish, and poultry.  Eat small meals often instead of 3 large meals a day. Eat your meals slowly, and in a place where you are relaxed. Avoid bending over or lying down until 2-3 hours after eating.  Avoid eating meals 2-3 hours before bed.  Avoid drinking a lot of liquid with meals.  Cook foods using methods other than frying. Bake, grill, or broil food instead.  Avoid or limit: ? Chocolate. ? Peppermint or spearmint. ? Alcohol. ? Pepper. ? Black and decaffeinated coffee. ? Black and decaffeinated tea. ? Bubbly (carbonated) soft drinks. ? Caffeinated energy drinks and soft drinks.  Limit high-fat foods such as: ? Fatty meat or fried foods. ? Whole milk, cream, butter, or ice cream. ? Nuts and nut butters. ? Pastries, donuts, and sweets made with butter or shortening.  Avoid foods that cause symptoms. These foods may be different for everyone. Common foods that cause symptoms include: ? Tomatoes. ? Oranges, lemons, and limes. ? Peppers. ? Spicy food. ? Onions and garlic. ? Vinegar. Lifestyle  Maintain a healthy weight. Ask your doctor what weight is healthy for you. If you need to lose weight, work with your doctor to do so safely.  Exercise for at least 30 minutes for 5 or  more days each week, or as told by your doctor.  Wear loose-fitting clothes.  Do not smoke. If you need help quitting, ask your doctor.  Sleep with the head of your bed higher than your feet. Use a wedge under the mattress or blocks under the bed frame to raise the head of the bed. Summary  When you have gastroesophageal reflux disease (GERD), food and lifestyle choices are very important in easing your symptoms.  Eat small meals often instead of 3 large meals a day. Eat your meals slowly, and in a place where you are relaxed.  Limit high-fat foods such as fatty meat or fried foods.  Avoid bending over or lying down until 2-3 hours after eating.  Avoid peppermint and spearmint, caffeine, alcohol, and chocolate. This information is not intended to replace advice given to you by your health care provider. Make sure you discuss any questions you have with your health care provider. Document Released: 05/23/2012 Document Revised: 12/28/2016 Document Reviewed: 12/28/2016 Elsevier Interactive Patient Education  2019 [employer].  Please be sure medication list is accurate. If a new problem present, please set up appointment sooner than planned today.

## 2019-01-08 NOTE — Progress Notes (Signed)
ACUTE VISIT  HPI:  Chief Complaint  Patient presents with  . Sore Throat    x2-3 days, seen at the ER and advised a referral to ENT due to strep    Anita Medina is a 78 y.o.female here today complaining of sore throat. She has had sore throat intermittently for "a few months", at least 6 months. Burning like sensation.  Non productive cough,worse in the morning. Intermittent dysphonia. She has not identified exacerbating or alleviating symptoms.  Sore Throat   This is a recurrent problem. The current episode started more than 1 month ago. The problem has been waxing and waning. There has been no fever. The pain is moderate. Associated symptoms include coughing and a hoarse voice. Pertinent negatives include no abdominal pain, congestion, diarrhea, drooling, ear discharge, ear pain, plugged ear sensation, neck pain, shortness of breath, stridor, swollen glands, trouble swallowing or vomiting. She has tried gargles for the symptoms. The treatment provided moderate relief.    No Hx of recent travel. No sick contact. No known insect bite.  Hx of allergies: Negative.  OTC medications for this problem: Throat lozenger,which helps.  She has been evaluated in the ER x 2 due to same symptoms, 09/24/18 and 01/06/19. Rapid strep and strep Cx negative.  Occasional heartburn and nausea. She was on Nexium,has not taken it for months. OTC Tums.  She has not noted abdominal pain,burping,or vomiting. Negative for tobacco use.   Review of Systems  Constitutional: Negative for activity change, appetite change, fatigue, fever and unexpected weight change.  HENT: Positive for hoarse voice, sore throat and voice change. Negative for congestion, drooling, ear discharge, ear pain, mouth sores, nosebleeds and trouble swallowing.   Eyes: Negative for redness and visual disturbance.  Respiratory: Positive for cough. Negative for chest tightness, shortness of breath, wheezing and  stridor.   Gastrointestinal: Positive for nausea. Negative for abdominal pain, diarrhea and vomiting.       Negative for changes in bowel habits.  Musculoskeletal: Negative for myalgias and neck pain.  Allergic/Immunologic: Negative for environmental allergies.      Current Outpatient Medications on File Prior to Visit  Medication Sig Dispense Refill  . Ascorbic Acid (VITAMIN C PO) Take by mouth.    Marland Kitchen. b complex vitamins tablet Take 1 tablet by mouth daily.      Marland Kitchen. CALCIUM PO Take by mouth.    . fish oil-omega-3 fatty acids 1000 MG capsule Take 2 g by mouth daily.       No current facility-administered medications on file prior to visit.      History reviewed. No pertinent past medical history. Allergies  Allergen Reactions  . Aspirin   . Clarithromycin   . Doxycycline     Social History   Socioeconomic History  . Marital status: Divorced    Spouse name: Not on file  . Number of children: Not on file  . Years of education: Not on file  . Highest education level: Not on file  Occupational History  . Not on file  Social Needs  . Financial resource strain: Not on file  . Food insecurity:    Worry: Not on file    Inability: Not on file  . Transportation needs:    Medical: Not on file    Non-medical: Not on file  Tobacco Use  . Smoking status: Never Smoker  . Smokeless tobacco: Never Used  Substance and Sexual Activity  . Alcohol use: No  .  Drug use: No  . Sexual activity: Not on file  Lifestyle  . Physical activity:    Days per week: Not on file    Minutes per session: Not on file  . Stress: Not on file  Relationships  . Social connections:    Talks on phone: Not on file    Gets together: Not on file    Attends religious service: Not on file    Active member of club or organization: Not on file    Attends meetings of clubs or organizations: Not on file    Relationship status: Not on file  Other Topics Concern  . Not on file  Social History Narrative  .  Not on file    Vitals:   01/08/19 1606  BP: 110/60  Pulse: 79  Resp: 12  Temp: 98.4 F (36.9 C)   Body mass index is 27.49 kg/m.   Physical Exam  Nursing note and vitals reviewed. Constitutional: She is oriented to person, place, and time. She appears well-developed. No distress.  HENT:  Head: Normocephalic and atraumatic.  Mouth/Throat: Oropharynx is clear and moist and mucous membranes are normal.  Eyes: Pupils are equal, round, and reactive to light. Conjunctivae are normal.  Cardiovascular: Normal rate and regular rhythm.  No murmur heard. Respiratory: Effort normal and breath sounds normal. No respiratory distress.  GI: Soft. She exhibits no mass. There is no hepatomegaly. There is no abdominal tenderness.  Musculoskeletal:        General: No edema.  Lymphadenopathy:       Head (right side): No submandibular adenopathy present.       Head (left side): No submandibular adenopathy present.    She has no cervical adenopathy.  Neurological: She is alert and oriented to person, place, and time. She has normal strength. No cranial nerve deficit. Gait normal.  Skin: Skin is warm. No rash noted. No erythema.  Psychiatric: She has a normal mood and affect.  Well groomed, good eye contact.    ASSESSMENT AND PLAN:  Anita Medina was seen today for sore throat.  Diagnoses and all orders for this visit:  Chronic sore throat We discussed possible etiologies,allergies vs GERD among some. I do not think further work up is needed today. Instructed about warning signs. ENT evaluation will be arranged.  -     Ambulatory referral to ENT  Gastroesophageal reflux disease, esophagitis presence not specified Most of her symptoms could be attributed to GERD. GERD precautions discussed. Nexium 20 mg bid recommended.  -     esomeprazole (NEXIUM) 20 MG capsule; Take 1 capsule (20 mg total) by mouth daily at 12 noon.     Return in about 2 months (around 03/09/2019) for  pcp.    Jamarkus Lisbon G. [place], MD  [facility]. [place] office.

## 2019-01-23 DIAGNOSIS — K219 Gastro-esophageal reflux disease without esophagitis: Secondary | ICD-10-CM | POA: Diagnosis not present

## 2019-01-24 ENCOUNTER — Encounter: Payer: Self-pay | Admitting: Family Medicine

## 2019-01-24 ENCOUNTER — Ambulatory Visit (INDEPENDENT_AMBULATORY_CARE_PROVIDER_SITE_OTHER): Payer: Medicare Other | Admitting: Family Medicine

## 2019-01-24 VITALS — BP 116/66 | HR 68 | Temp 98.3°F | Resp 12 | Ht 65.0 in | Wt 160.5 lb

## 2019-01-24 DIAGNOSIS — K219 Gastro-esophageal reflux disease without esophagitis: Secondary | ICD-10-CM

## 2019-01-24 DIAGNOSIS — E119 Type 2 diabetes mellitus without complications: Secondary | ICD-10-CM | POA: Diagnosis not present

## 2019-01-24 LAB — BASIC METABOLIC PANEL
BUN: 19 mg/dL (ref 6–23)
CO2: 27 mEq/L (ref 19–32)
Calcium: 9.6 mg/dL (ref 8.4–10.5)
Chloride: 105 mEq/L (ref 96–112)
Creatinine, Ser: 0.83 mg/dL (ref 0.40–1.20)
GFR: 80.37 mL/min (ref >60.00)
Glucose, Bld: 125 mg/dL — ABNORMAL HIGH (ref 70–99)
POTASSIUM: 3.9 meq/L (ref 3.5–5.1)
SODIUM: 142 meq/L (ref 135–145)

## 2019-01-24 LAB — MICROALBUMIN / CREATININE URINE RATIO
CREATININE, U: 1 mg/dL
MICROALB UR: 9.4 mg/dL — AB (ref 0.0–1.9)
MICROALB/CREAT RATIO: 970.8 mg/g — AB (ref 0.0–30.0)

## 2019-01-24 LAB — HEMOGLOBIN A1C: Hgb A1c MFr Bld: 6.5 % (ref 4.6–6.5)

## 2019-01-24 NOTE — Patient Instructions (Addendum)
A few things to remember from today's visit:   Controlled type 2 diabetes mellitus without complication, without long-term current use of insulin (HCC) - Plan: Hemoglobin A1c, Basic metabolic panel, Microalbumin / creatinine urine ratio  Gastroesophagitis  Continue nonpharmacologic treatment for diabetes and acid reflux.  Please be sure medication list is accurate. If a new problem present, please set up appointment sooner than planned today.

## 2019-01-24 NOTE — Assessment & Plan Note (Signed)
Continue nonpharmacologic treatment. Adequate foot care discussed as well as annual eye exam. Further recommendation will be given according to A1c results.

## 2019-01-24 NOTE — Progress Notes (Signed)
HPI:   [name] is a 78 y.o. female, who is here today to establish care.  Former PCP: Dr Chestine Spore Last preventive routine visit: 01/2016.  Chronic medical problems: DM II and GERD among some.  She lives alone. Follows a healthful diet and exercises regularly (walks). Former smoker. Quit alcohol and tobacco in 1970's.  Diabetes Mellitus II:  Checking BS's : Not checking.  She is on non pharmacologic treatment. She took hypoglycemic agents in the past. She denies abdominal pain, nausea, vomiting, polydipsia, polyuria, or polyphagia. Negativenumbness, tingling, or burning.   GERD: She is not longer on PPI. Symptoms improved,so she stopped medication. She is avoiding foods that could irritate    She has no concerns today.   Review of Systems  Constitutional: Negative for activity change, appetite change, fatigue and fever.  HENT: Negative for mouth sores, nosebleeds and trouble swallowing.   Eyes: Negative for redness and visual disturbance.  Respiratory: Negative for cough, shortness of breath and wheezing.   Cardiovascular: Negative for chest pain, palpitations and leg swelling.  Gastrointestinal: Negative for abdominal pain, nausea and vomiting.       Negative for changes in bowel habits.  Endocrine: Negative for polydipsia, polyphagia and polyuria.  Genitourinary: Negative for decreased urine volume and hematuria.  Skin: Negative for rash and wound.  Neurological: Negative for syncope, weakness, numbness and headaches.      Current Outpatient Medications on File Prior to Visit  Medication Sig Dispense Refill  . Ascorbic Acid (VITAMIN C PO) Take by mouth.    Marland Kitchen b complex vitamins tablet Take 1 tablet by mouth daily.      Marland Kitchen CALCIUM PO Take by mouth.     No current facility-administered medications on file prior to visit.      History reviewed. No pertinent past medical history. Allergies  Allergen Reactions  . Aspirin   . Clarithromycin   .  Doxycycline     Family History  Problem Relation Age of Onset  . Cancer Other   . Diabetes Other   . Hypertension Other     Social History   Socioeconomic History  . Marital status: Divorced    Spouse name: Not on file  . Number of children: Not on file  . Years of education: Not on file  . Highest education level: Not on file  Occupational History  . Not on file  Social Needs  . Financial resource strain: Not on file  . Food insecurity:    Worry: Not on file    Inability: Not on file  . Transportation needs:    Medical: Not on file    Non-medical: Not on file  Tobacco Use  . Smoking status: Never Smoker  . Smokeless tobacco: Never Used  Substance and Sexual Activity  . Alcohol use: No  . Drug use: No  . Sexual activity: Not Currently  Lifestyle  . Physical activity:    Days per week: Not on file    Minutes per session: Not on file  . Stress: Not on file  Relationships  . Social connections:    Talks on phone: Not on file    Gets together: Not on file    Attends religious service: Not on file    Active member of club or organization: Not on file    Attends meetings of clubs or organizations: Not on file    Relationship status: Not on file  Other Topics Concern  . Not on file  Social History Narrative  . Not on file    Vitals:   01/24/19 1001  BP: 116/66  Pulse: 68  Resp: 12  Temp: 98.3 F (36.8 C)  SpO2: 97%    Body mass index is 26.71 kg/m.  Physical Exam  Nursing note and vitals reviewed. Constitutional: She is oriented to person, place, and time. She appears well-developed and well-nourished. No distress.  HENT:  Head: Normocephalic and atraumatic.  Mouth/Throat: Oropharynx is clear and moist and mucous membranes are normal.  Eyes: Pupils are equal, round, and reactive to light. Conjunctivae are normal.  Cardiovascular: Normal rate and regular rhythm.  No murmur heard. Pulses:      Dorsalis pedis pulses are 2+ on the right side and 2+ on  the left side.  Respiratory: Effort normal and breath sounds normal. No respiratory distress.  GI: Soft. She exhibits no mass. There is no hepatomegaly. There is no abdominal tenderness.  Musculoskeletal:        General: No edema.  Lymphadenopathy:    She has no cervical adenopathy.  Neurological: She is alert and oriented to person, place, and time. She has normal strength. No cranial nerve deficit. Gait normal.  Skin: Skin is warm. No rash noted. No erythema.  Psychiatric: She has a normal mood and affect.  Well groomed, good eye contact.    ASSESSMENT AND PLAN:  [name] was seen today for establish care.  Orders Placed This Encounter  Procedures  . Hemoglobin A1c  . Basic metabolic panel  . Microalbumin / creatinine urine ratio   Lab Results  Component Value Date   HGBA1C 6.5 01/24/2019   Lab Results  Component Value Date   MICROALBUR 9.4 (H) 01/24/2019   Lab Results  Component Value Date   CREATININE 0.83 01/24/2019   BUN 19 01/24/2019   NA 142 01/24/2019   K 3.9 01/24/2019   CL 105 01/24/2019   CO2 27 01/24/2019     Gastroesophagitis Probably has been well controlled with nonpharmacologic treatment. Continue GERD precautions.  Controlled type 2 diabetes mellitus without complication, without long-term current use of insulin (HCC) Continue nonpharmacologic treatment. Adequate foot care discussed as well as annual eye exam. Further recommendation will be given according to A1c results.      Return in about 6 months (around 07/25/2019) for Needs Medicare visit.Marland Kitchen     Valia Wingard G. [place], MD  [facility]. [place] office.

## 2019-01-24 NOTE — Assessment & Plan Note (Signed)
Probably has been well controlled with nonpharmacologic treatment. Continue GERD precautions.

## 2019-01-30 ENCOUNTER — Other Ambulatory Visit: Payer: Self-pay | Admitting: *Deleted

## 2019-01-30 MED ORDER — LOSARTAN POTASSIUM 25 MG PO TABS: ORAL_TABLET | ORAL | 0 refills | Status: DC

## 2019-02-09 ENCOUNTER — Other Ambulatory Visit (INDEPENDENT_AMBULATORY_CARE_PROVIDER_SITE_OTHER): Payer: Medicare Other

## 2019-02-09 ENCOUNTER — Other Ambulatory Visit: Payer: Self-pay | Admitting: *Deleted

## 2019-02-09 DIAGNOSIS — R809 Proteinuria, unspecified: Secondary | ICD-10-CM

## 2019-02-09 LAB — BASIC METABOLIC PANEL
BUN: 20 mg/dL (ref 6–23)
CO2: 26 meq/L (ref 19–32)
Calcium: 9.4 mg/dL (ref 8.4–10.5)
Chloride: 103 mEq/L (ref 96–112)
Creatinine, Ser: 0.8 mg/dL (ref 0.40–1.20)
GFR: 83.85 mL/min (ref >60.00)
GLUCOSE: 150 mg/dL — AB (ref 70–99)
POTASSIUM: 4 meq/L (ref 3.5–5.1)
SODIUM: 139 meq/L (ref 135–145)

## 2019-04-23 ENCOUNTER — Other Ambulatory Visit: Payer: Self-pay | Admitting: Family Medicine

## 2019-07-25 ENCOUNTER — Ambulatory Visit: Payer: Medicare Other | Admitting: Family Medicine

## 2019-10-18 DIAGNOSIS — Z719 Counseling, unspecified: Secondary | ICD-10-CM | POA: Diagnosis not present

## 2020-04-26 ENCOUNTER — Ambulatory Visit: Payer: Medicare Other | Attending: Internal Medicine

## 2020-04-26 DIAGNOSIS — Z23 Encounter for immunization: Secondary | ICD-10-CM

## 2020-04-26 NOTE — Progress Notes (Signed)
   Covid-19 Vaccination Clinic  Name:  Anita Medina    MRN: [id number] DOB: 22-Nov-1941  04/26/2020  [name] was observed post Covid-19 immunization for 15 minutes without incident. She was provided with Vaccine Information Sheet and instruction to access the V-Safe system.   [name] was instructed to call 911 with any severe reactions post vaccine: Marland Kitchen Difficulty breathing  . Swelling of face and throat  . A fast heartbeat  . A bad rash all over body  . Dizziness and weakness   Immunizations Administered    Name Date Dose VIS Date Route   Pfizer COVID-19 Vaccine 04/26/2020 10:38 AM 0.3 mL 01/30/2019 Intramuscular   Manufacturer: [employer], [employer]   Lot: [id number]   NDC: [address]-6

## 2020-05-19 ENCOUNTER — Ambulatory Visit: Payer: Medicare Other | Attending: Internal Medicine

## 2020-05-19 DIAGNOSIS — Z23 Encounter for immunization: Secondary | ICD-10-CM

## 2020-05-19 NOTE — Progress Notes (Signed)
   Covid-19 Vaccination Clinic  Name:  Anita Medina    MRN: [id number] DOB: Apr 15, 1941  05/19/2020  [name] was observed post Covid-19 immunization for 15 minutes without incident. She was provided with Vaccine Information Sheet and instruction to access the V-Safe system.   [name] was instructed to call 911 with any severe reactions post vaccine: Marland Kitchen Difficulty breathing  . Swelling of face and throat  . A fast heartbeat  . A bad rash all over body  . Dizziness and weakness   Immunizations Administered    Name Date Dose VIS Date Route   Pfizer COVID-19 Vaccine 05/19/2020 11:08 AM 0.3 mL 01/30/2019 Intramuscular   Manufacturer: [employer], [employer]   Lot: [id number]   NDC: [address]-0

## 2020-09-22 ENCOUNTER — Encounter: Payer: Medicare Other | Admitting: Family Medicine

## 2021-06-15 ENCOUNTER — Telehealth: Payer: Self-pay | Admitting: Family Medicine

## 2021-06-15 NOTE — Telephone Encounter (Signed)
Tried calling patient schedule Medicare Annual Wellness Visit (AWV) either virtually or in office.  Problems with phone connecting   AWV-I per PALMETTO 12/06/09 please schedule at anytime with [facility] Nurse Health Advisor 1 or 2  Patient also needs needs appointment with pcp last appointment 01/24/2019  This should be a 45 minute visit.

## 2021-07-16 ENCOUNTER — Telehealth: Payer: Self-pay | Admitting: Family Medicine

## 2021-07-16 NOTE — Telephone Encounter (Signed)
Left message for patient to call back and schedule Medicare Annual Wellness Visit (AWV) either virtually or in office.  Left both  my jabber number [phone] and office number     AWV-I per PALMETTO 12/06/09 please schedule at anytime with [facility] Nurse Health Advisor 1 or 2   This should be a 45 minute visit.  

## 2021-08-27 ENCOUNTER — Telehealth: Payer: Self-pay | Admitting: Family Medicine

## 2021-08-27 NOTE — Telephone Encounter (Signed)
Tried calling patient to schedule Medicare Annual Wellness Visit (AWV) either virtually or in office. Left  my Zachery Conch number [phone]  No answer    AWV-I per PALMETTO 12/06/09 please schedule at anytime with [facility] Nurse Health Advisor 1 or 2   This should be a 45 minute visit.   Patient also needs appointment with pcp last appointment 01/24/2019

## 2022-03-30 DIAGNOSIS — E119 Type 2 diabetes mellitus without complications: Secondary | ICD-10-CM | POA: Diagnosis not present

## 2022-05-07 DIAGNOSIS — E119 Type 2 diabetes mellitus without complications: Secondary | ICD-10-CM | POA: Diagnosis not present

## 2022-05-07 DIAGNOSIS — F03B0 Unspecified dementia, moderate, without behavioral disturbance, psychotic disturbance, mood disturbance, and anxiety: Secondary | ICD-10-CM | POA: Diagnosis not present

## 2022-05-07 DIAGNOSIS — Z0000 Encounter for general adult medical examination without abnormal findings: Secondary | ICD-10-CM | POA: Diagnosis not present

## 2022-05-31 DIAGNOSIS — H2513 Age-related nuclear cataract, bilateral: Secondary | ICD-10-CM | POA: Diagnosis not present

## 2022-05-31 DIAGNOSIS — H43813 Vitreous degeneration, bilateral: Secondary | ICD-10-CM | POA: Diagnosis not present

## 2022-05-31 DIAGNOSIS — H40023 Open angle with borderline findings, high risk, bilateral: Secondary | ICD-10-CM | POA: Diagnosis not present

## 2023-06-17 ENCOUNTER — Ambulatory Visit: Payer: 59

## 2023-06-17 ENCOUNTER — Other Ambulatory Visit: Payer: Self-pay | Admitting: Internal Medicine

## 2023-06-17 ENCOUNTER — Ambulatory Visit
Admission: RE | Admit: 2023-06-17 | Discharge: 2023-06-17 | Disposition: A | Discharge status: Home / Self Care | Payer: 59 | Source: Ambulatory Visit | Attending: Internal Medicine | Admitting: Internal Medicine

## 2023-06-17 DIAGNOSIS — M4316 Spondylolisthesis, lumbar region: Secondary | ICD-10-CM | POA: Diagnosis not present

## 2023-06-17 DIAGNOSIS — M25551 Pain in right hip: Secondary | ICD-10-CM

## 2023-06-17 DIAGNOSIS — L750 Bromhidrosis: Secondary | ICD-10-CM | POA: Diagnosis not present

## 2023-06-17 DIAGNOSIS — M545 Low back pain, unspecified: Secondary | ICD-10-CM

## 2023-06-17 DIAGNOSIS — Z79899 Other long term (current) drug therapy: Secondary | ICD-10-CM | POA: Diagnosis not present

## 2023-06-17 DIAGNOSIS — E46 Unspecified protein-calorie malnutrition: Secondary | ICD-10-CM | POA: Diagnosis not present

## 2023-06-17 DIAGNOSIS — R451 Restlessness and agitation: Secondary | ICD-10-CM | POA: Diagnosis not present

## 2023-06-17 DIAGNOSIS — Z1231 Encounter for screening mammogram for malignant neoplasm of breast: Secondary | ICD-10-CM

## 2023-06-17 DIAGNOSIS — R2681 Unsteadiness on feet: Secondary | ICD-10-CM | POA: Diagnosis not present

## 2023-06-17 DIAGNOSIS — M4186 Other forms of scoliosis, lumbar region: Secondary | ICD-10-CM | POA: Diagnosis not present

## 2023-06-17 DIAGNOSIS — E119 Type 2 diabetes mellitus without complications: Secondary | ICD-10-CM | POA: Diagnosis not present

## 2023-06-17 DIAGNOSIS — M7061 Trochanteric bursitis, right hip: Secondary | ICD-10-CM | POA: Diagnosis not present

## 2023-06-17 DIAGNOSIS — M47816 Spondylosis without myelopathy or radiculopathy, lumbar region: Secondary | ICD-10-CM | POA: Diagnosis not present

## 2023-06-17 DIAGNOSIS — F03918 Unspecified dementia, unspecified severity, with other behavioral disturbance: Secondary | ICD-10-CM | POA: Diagnosis not present

## 2023-06-17 DIAGNOSIS — Z0000 Encounter for general adult medical examination without abnormal findings: Secondary | ICD-10-CM | POA: Diagnosis not present

## 2023-06-20 ENCOUNTER — Ambulatory Visit
Admission: RE | Admit: 2023-06-20 | Discharge: 2023-06-20 | Disposition: A | Discharge status: Home / Self Care | Payer: 59 | Source: Ambulatory Visit | Attending: Internal Medicine | Admitting: Internal Medicine

## 2023-06-20 DIAGNOSIS — Z1231 Encounter for screening mammogram for malignant neoplasm of breast: Secondary | ICD-10-CM

## 2023-06-21 DIAGNOSIS — F03C0 Unspecified dementia, severe, without behavioral disturbance, psychotic disturbance, mood disturbance, and anxiety: Secondary | ICD-10-CM | POA: Diagnosis not present

## 2023-06-22 DIAGNOSIS — K219 Gastro-esophageal reflux disease without esophagitis: Secondary | ICD-10-CM | POA: Diagnosis not present

## 2023-06-22 DIAGNOSIS — F03B0 Unspecified dementia, moderate, without behavioral disturbance, psychotic disturbance, mood disturbance, and anxiety: Secondary | ICD-10-CM | POA: Diagnosis not present

## 2023-06-22 DIAGNOSIS — E119 Type 2 diabetes mellitus without complications: Secondary | ICD-10-CM | POA: Diagnosis not present

## 2023-06-22 DIAGNOSIS — M1990 Unspecified osteoarthritis, unspecified site: Secondary | ICD-10-CM | POA: Diagnosis not present

## 2023-06-22 DIAGNOSIS — Z87891 Personal history of nicotine dependence: Secondary | ICD-10-CM | POA: Diagnosis not present

## 2023-06-28 DIAGNOSIS — M1990 Unspecified osteoarthritis, unspecified site: Secondary | ICD-10-CM | POA: Diagnosis not present

## 2023-06-28 DIAGNOSIS — Z87891 Personal history of nicotine dependence: Secondary | ICD-10-CM | POA: Diagnosis not present

## 2023-06-28 DIAGNOSIS — F03B0 Unspecified dementia, moderate, without behavioral disturbance, psychotic disturbance, mood disturbance, and anxiety: Secondary | ICD-10-CM | POA: Diagnosis not present

## 2023-06-28 DIAGNOSIS — K219 Gastro-esophageal reflux disease without esophagitis: Secondary | ICD-10-CM | POA: Diagnosis not present

## 2023-06-28 DIAGNOSIS — E119 Type 2 diabetes mellitus without complications: Secondary | ICD-10-CM | POA: Diagnosis not present

## 2023-06-30 DIAGNOSIS — F03B0 Unspecified dementia, moderate, without behavioral disturbance, psychotic disturbance, mood disturbance, and anxiety: Secondary | ICD-10-CM | POA: Diagnosis not present

## 2023-06-30 DIAGNOSIS — E119 Type 2 diabetes mellitus without complications: Secondary | ICD-10-CM | POA: Diagnosis not present

## 2023-06-30 DIAGNOSIS — M1990 Unspecified osteoarthritis, unspecified site: Secondary | ICD-10-CM | POA: Diagnosis not present

## 2023-06-30 DIAGNOSIS — K219 Gastro-esophageal reflux disease without esophagitis: Secondary | ICD-10-CM | POA: Diagnosis not present

## 2023-06-30 DIAGNOSIS — Z87891 Personal history of nicotine dependence: Secondary | ICD-10-CM | POA: Diagnosis not present

## 2023-07-05 DIAGNOSIS — E119 Type 2 diabetes mellitus without complications: Secondary | ICD-10-CM | POA: Diagnosis not present

## 2023-07-05 DIAGNOSIS — M1990 Unspecified osteoarthritis, unspecified site: Secondary | ICD-10-CM | POA: Diagnosis not present

## 2023-07-05 DIAGNOSIS — K219 Gastro-esophageal reflux disease without esophagitis: Secondary | ICD-10-CM | POA: Diagnosis not present

## 2023-07-05 DIAGNOSIS — Z87891 Personal history of nicotine dependence: Secondary | ICD-10-CM | POA: Diagnosis not present

## 2023-07-05 DIAGNOSIS — F03B0 Unspecified dementia, moderate, without behavioral disturbance, psychotic disturbance, mood disturbance, and anxiety: Secondary | ICD-10-CM | POA: Diagnosis not present

## 2023-07-07 DIAGNOSIS — Z87891 Personal history of nicotine dependence: Secondary | ICD-10-CM | POA: Diagnosis not present

## 2023-07-07 DIAGNOSIS — E119 Type 2 diabetes mellitus without complications: Secondary | ICD-10-CM | POA: Diagnosis not present

## 2023-07-07 DIAGNOSIS — K219 Gastro-esophageal reflux disease without esophagitis: Secondary | ICD-10-CM | POA: Diagnosis not present

## 2023-07-07 DIAGNOSIS — F03B0 Unspecified dementia, moderate, without behavioral disturbance, psychotic disturbance, mood disturbance, and anxiety: Secondary | ICD-10-CM | POA: Diagnosis not present

## 2023-07-07 DIAGNOSIS — M1990 Unspecified osteoarthritis, unspecified site: Secondary | ICD-10-CM | POA: Diagnosis not present

## 2023-07-12 DIAGNOSIS — E119 Type 2 diabetes mellitus without complications: Secondary | ICD-10-CM | POA: Diagnosis not present

## 2023-07-12 DIAGNOSIS — Z87891 Personal history of nicotine dependence: Secondary | ICD-10-CM | POA: Diagnosis not present

## 2023-07-12 DIAGNOSIS — K219 Gastro-esophageal reflux disease without esophagitis: Secondary | ICD-10-CM | POA: Diagnosis not present

## 2023-07-12 DIAGNOSIS — F03B0 Unspecified dementia, moderate, without behavioral disturbance, psychotic disturbance, mood disturbance, and anxiety: Secondary | ICD-10-CM | POA: Diagnosis not present

## 2023-07-12 DIAGNOSIS — M1990 Unspecified osteoarthritis, unspecified site: Secondary | ICD-10-CM | POA: Diagnosis not present

## 2023-07-15 ENCOUNTER — Encounter: Payer: Self-pay | Admitting: Physician Assistant

## 2023-07-15 ENCOUNTER — Ambulatory Visit (INDEPENDENT_AMBULATORY_CARE_PROVIDER_SITE_OTHER): Payer: 59 | Admitting: Physician Assistant

## 2023-07-15 ENCOUNTER — Ambulatory Visit: Payer: 59

## 2023-07-15 ENCOUNTER — Other Ambulatory Visit: Payer: 59

## 2023-07-15 VITALS — BP 148/73 | HR 72 | Resp 20 | Ht 66.0 in | Wt 131.0 lb

## 2023-07-15 DIAGNOSIS — R413 Other amnesia: Secondary | ICD-10-CM

## 2023-07-15 LAB — VITAMIN B12: Vitamin B-12: 410 pg/mL (ref 211–911)

## 2023-07-15 LAB — TSH: TSH: 1.64 u[IU]/mL (ref 0.35–5.50)

## 2023-07-15 MED ORDER — DONEPEZIL HCL 10 MG PO TABS: ORAL_TABLET | ORAL | 11 refills | Status: DC

## 2023-07-15 NOTE — Progress Notes (Signed)
Assessment/Plan:    The patient is seen in neurologic consultation at the request of Thana Ates, MD for the evaluation of memory.  Anita Medina is a very pleasant 82 y.o. year old RH female with a history of hypertension, hyperlipidemia, prediabetes, B12 deficiency who has not been on any regular medications in years, seen today for evaluation of memory loss. MoCA today is 14 /30. Patient is able to participate on some IADLs, but needs assistance with showering.  She no longer drives.  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   Memory Impairment  MRI brain without contrast to assess for underlying structural abnormality and assess vascular load  Check B12, TSH Start Donepezil 10 mg :Take half tablet (5 mg) daily for 2 weeks, then increase to the full tablet at 10 mg daily. Side effects discussed   Neuropsych testing for clarity of diagnosis and disease trajectory   Recommend good control of cardiovascular risk factors, recommend tight control of BP, cholesterol and sugars.   Continue to control mood as per PCP [place] up in 3 months  Subjective:    The patient is accompanied by her children who supplements the history.    How long did patient have memory difficulties? For years, at least 2-3, worse over the last year.  Patient has difficulty remembering new information, recent conversations. "She is pretty good with people names"-her son says.  Likes to "read anything I get my hands on ", brain games, likes Corky Sing, going to church and watch TV repeats oneself?  Endorsed, asked questions frequently about the same topic. Disoriented when walking into a room?  Denies Leaving objects in unusual places?  Sometimes, "like dishes in different places".   Wandering behavior? denies   Any personality changes ? denies   Any history of depression?: denies   Hallucinations or paranoia?  denies  "Jesus, no!" Seizures? denies     Any sleep changes?  Sleeps well . Denies  vivid dreams, REM behavior or sleepwalking   Sleep apnea? denies   Any hygiene concerns? "It is becoming and issue, sometimes I have to fight her" Independent of bathing and dressing? "Sometimes she likes to wear the same clothes for days"  Does the patient need help with medications? "She is only on vitamins" . Patient is in charge.  Her daughter is going to buy a box. Who is in charge of the finances?  Son  is in charge     Any changes in appetite?   Denies, does not take any nutritional supplements     Patient have trouble swallowing?  denies   Does the patient cook? Yes. No kitchen accidents    Any headaches?  denies   Chronic back pain?  She has knee arthritis which may limit her mobility. Ambulates with difficulty?  Denies.  She is learning how to use the cane. Recent falls or head injuries? denies     Vision changes? Denies Stroke like symptoms?  denies   Any tremors?  denies   Any anosmia?  denies   Any incontinence of urine? denies   Any bowel dysfunction? denies      Patient lives alone, family lives nearby.  Home health aide being hired to help her with hygiene, after she was not cleaning as before, however when showers, etc. "and for company".  She goes to [employer] adult day program 2 times a week as well.  She enjoys the  activities very much. History of heavy alcohol intake? denies.  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 History of heavy tobacco use? denies   Family history of dementia? 1 aunt with AD       Does patient drive? 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   Allergies  Allergen Reactions   Aspirin    Clarithromycin    Doxycycline     Current Outpatient Medications  Medication Instructions   Ascorbic Acid (VITAMIN C PO) Take by mouth.   b complex vitamins tablet 1 tablet, Daily   CALCIUM PO Take by mouth.   donepezil (ARICEPT) 10 MG  tablet Take half tablet (5 mg) daily for 2 weeks, then increase to the full tablet at 10 mg daily   losartan (COZAAR) 25 MG tablet TAKE 1/2 TABLET BY MOUTH EVERY DAY     VITALS:   Vitals:   07/15/23 0758  BP: (!) 142/81  Pulse: 72  Resp: 20  SpO2: 96%  Weight: 131 lb (59.4 kg)  Height: 5\' 6"  (1.676 m)       No data to display          PHYSICAL EXAM   HEENT:  Normocephalic, atraumatic. The mucous membranes are moist. The superficial temporal arteries are without ropiness or tenderness. Cardiovascular: Regular rate and rhythm. Lungs: Clear to auscultation bilaterally. Neck: There are no carotid bruits noted bilaterally.  NEUROLOGICAL:    07/15/2023    8:00 AM  Montreal Cognitive Assessment   Visuospatial/ Executive (0/5) 3  Naming (0/3) 2  Attention: Read list of digits (0/2) 2  Attention: Read list of letters (0/1) 1  Attention: Serial 7 subtraction starting at 100 (0/3) 0  Language: Repeat phrase (0/2) 2  Language : Fluency (0/1) 1  Abstraction (0/2) 1  Delayed Recall (0/5) 0  Orientation (0/6) 1  Total 13  Adjusted Score (based on education) 14        No data to display           Orientation:  Alert and oriented to person,not to  place and time. No aphasia or dysarthria. Fund of knowledge is reduced. Recent and remote memory impaired.  Attention and concentration are reduced.  Able to name objects and unable to repeat phrases. Delayed recall 0/5    Cranial nerves: There is good facial symmetry. Extraocular muscles are intact and visual fields are full to confrontational testing. Speech is fluent and clear, no tongue deviation. Hearing is intact to conversational tone. Tone: Tone is good throughout. Sensation: Sensation is intact to light touch and pinprick throughout. Vibration is intact at the bilateral big toe. Coordination: The patient has no difficulty with RAM's or FNF bilaterally. Normal finger to nose  Motor: Strength is 5/5 in the bilateral upper and  lower extremities. There is no pronator drift. There are no fasciculations noted. DTR's: Deep tendon reflexes are 2/4 .  Plantar responses are downgoing bilaterally. Gait and Station: The patient is able to ambulate with some difficulty. Needs a cane to ambulate. Gait is cautious and narrow.      Thank you for allowing [place] the opportunity to participate in the care of this nice patient. Please do not hesitate to contact us for any questions or concerns.   Total time spent on today's visit was 57 minutes dedicated to this patient today, preparing to see patient, examining the patient, ordering tests and/or  medications and counseling the patient, documenting clinical information in the EHR or other health record, independently interpreting results and communicating results to the patient/family, discussing treatment and goals, answering patient's questions and coordinating care.  Cc:  Thana Ates, MD  Marlowe Kays 07/15/2023 8:58 AM

## 2023-07-15 NOTE — Patient Instructions (Addendum)
It was a pleasure to see you today at our office.   Recommendations:  Neurocognitive evaluation at our office   MRI of the brain, the radiology office will call you to arrange you appointment   Check labs today   We will start donepezil half tablet (5mg ) daily for 2  weeks.  If you are tolerating the medication, then after 2 weeks, we will increase the dose to a full tablet of 10 mg daily.   Follow up in 3 months   For psychiatric meds, mood meds: Please have your primary care physician manage these medications.  If you have any severe symptoms of a stroke, or other severe issues such as confusion,severe chills or fever, etc call 911 or go to the ER as you may need to be evaluated further    For assessment of decision of mental capacity and competency:  Call Dr. Erick Blinks, geriatric psychiatrist at [phone]  Counseling regarding caregiver distress, including caregiver depression, anxiety and issues regarding community resources, adult day care programs, adult living facilities, or memory care questions:  please contact your  Primary Doctor's Social Worker   Whom to call: Memory  decline, memory medications: Call our office [phone]    [email or web address]   RECOMMENDATIONS FOR ALL PATIENTS WITH MEMORY PROBLEMS: 1. Continue to exercise (Recommend 30 minutes of walking everyday, or 3 hours every week) 2. Increase social interactions - continue going to [place] and enjoy social gatherings with friends and family 3. Eat healthy, avoid fried foods and eat more fruits and vegetables 4. Maintain adequate blood pressure, blood sugar, and blood cholesterol level. Reducing the risk of stroke and cardiovascular disease also helps promoting better memory. 5. Avoid stressful situations. Live a simple life and avoid aggravations. Organize your time and prepare for the next day in anticipation. 6. Sleep well, avoid any interruptions of  sleep and avoid any distractions in the bedroom that may interfere with adequate sleep quality 7. Avoid sugar, avoid sweets as there is a strong link between excessive sugar intake, diabetes, and cognitive impairment We discussed the Mediterranean diet, which has been shown to help patients reduce the risk of progressive memory disorders and reduces cardiovascular risk. This includes eating fish, eat fruits and green leafy vegetables, nuts like almonds and hazelnuts, walnuts, and also use olive oil. Avoid fast foods and fried foods as much as possible. Avoid sweets and sugar as sugar use has been linked to worsening of memory function.  There is always a concern of gradual progression of memory problems. If this is the case, then we may need to adjust level of care according to patient needs. Support, both to the patient and caregiver, should then be put into place.      You have been referred for a neuropsychological evaluation (i.e., evaluation of memory and thinking abilities). Please bring someone with you to this appointment if possible, as it is helpful for the doctor to hear from both you and another adult who knows you well. Please bring eyeglasses and hearing aids if you wear them.    The evaluation will take approximately 3 hours and has two parts:   The first part is a clinical interview with the neuropsychologist (Dr. Milbert Coulter or Dr. Roseanne Reno). During the interview, the neuropsychologist will speak with you and the individual you brought to the appointment.    The second part of the evaluation is testing with the doctor's technician Annabelle Harman or Selena Batten). During the testing, the technician will ask you to remember  different types of material, solve problems, and answer some questionnaires. Your family member will not be present for this portion of the evaluation.   Please note: We must reserve several hours of the neuropsychologist's time and the psychometrician's time for your evaluation  appointment. As such, there is a No-Show fee of $100. If you are unable to attend any of your appointments, please contact our office as soon as possible to reschedule.      FALL PRECAUTIONS: Be cautious when walking. Scan the area for obstacles that may increase the risk of trips and falls. When getting up in the mornings, sit up at the edge of the bed for a few minutes before getting out of bed. Consider elevating the bed at the head end to avoid drop of blood pressure when getting up. Walk always in a well-lit room (use night lights in the walls). Avoid area rugs or power cords from appliances in the middle of the walkways. Use a walker or a cane if necessary and consider physical therapy for balance exercise. Get your eyesight checked regularly.  FINANCIAL OVERSIGHT: Supervision, especially oversight when making financial decisions or transactions is also recommended.  HOME SAFETY: Consider the safety of the kitchen when operating appliances like stoves, microwave oven, and blender. Consider having supervision and share cooking responsibilities until no longer able to participate in those. Accidents with firearms and other hazards in the house should be identified and addressed as well.   ABILITY TO BE LEFT ALONE: If patient is unable to contact 911 operator, consider using LifeLine, or when the need is there, arrange for someone to stay with patients. Smoking is a fire hazard, consider supervision or cessation. Risk of wandering should be assessed by caregiver and if detected at any point, supervision and safe proof recommendations should be instituted.  MEDICATION SUPERVISION: Inability to self-administer medication needs to be constantly addressed. Implement a mechanism to ensure safe administration of the medications.      Mediterranean Diet A Mediterranean diet refers to food and lifestyle choices that are based on the traditions of countries located on the [employer]. This way  of eating has been shown to help prevent certain conditions and improve outcomes for people who have chronic diseases, like kidney disease and heart disease. What are tips for following this plan? Lifestyle  Cook and eat meals together with your family, when possible. Drink enough fluid to keep your urine clear or pale yellow. Be physically active every day. This includes: Aerobic exercise like running or swimming. Leisure activities like gardening, walking, or housework. Get 7-8 hours of sleep each night. If recommended by your health care provider, drink red wine in moderation. This means 1 glass a day for nonpregnant women and 2 glasses a day for men. A glass of wine equals 5 oz (150 mL). Reading food labels  Check the serving size of packaged foods. For foods such as rice and pasta, the serving size refers to the amount of cooked product, not dry. Check the total fat in packaged foods. Avoid foods that have saturated fat or trans fats. Check the ingredients list for added sugars, such as corn syrup. Shopping  At the grocery store, buy most of your food from the areas near the walls of the store. This includes: Fresh fruits and vegetables (produce). Grains, beans, nuts, and seeds. Some of these may be available in unpackaged forms or large amounts (in bulk). Fresh seafood. Poultry and eggs. Low-fat dairy products. Buy whole ingredients instead of  prepackaged foods. Buy fresh fruits and vegetables in-season from local farmers markets. Buy frozen fruits and vegetables in resealable bags. If you do not have access to quality fresh seafood, buy precooked frozen shrimp or canned fish, such as tuna, salmon, or sardines. Buy small amounts of raw or cooked vegetables, salads, or olives from the deli or salad bar at your store. Stock your pantry so you always have certain foods on hand, such as olive oil, canned tuna, canned tomatoes, rice, pasta, and beans. Cooking  Cook foods with  extra-virgin olive oil instead of using butter or other vegetable oils. Have meat as a side dish, and have vegetables or grains as your main dish. This means having meat in small portions or adding small amounts of meat to foods like pasta or stew. Use beans or vegetables instead of meat in common dishes like chili or lasagna. Experiment with different cooking methods. Try roasting or broiling vegetables instead of steaming or sauteing them. Add frozen vegetables to soups, stews, pasta, or rice. Add nuts or seeds for added healthy fat at each meal. You can add these to yogurt, salads, or vegetable dishes. Marinate fish or vegetables using olive oil, lemon juice, garlic, and fresh herbs. Meal planning  Plan to eat 1 vegetarian meal one day each week. Try to work up to 2 vegetarian meals, if possible. Eat seafood 2 or more times a week. Have healthy snacks readily available, such as: Vegetable sticks with hummus. Greek yogurt. Fruit and nut trail mix. Eat balanced meals throughout the week. This includes: Fruit: 2-3 servings a day Vegetables: 4-5 servings a day Low-fat dairy: 2 servings a day Fish, poultry, or lean meat: 1 serving a day Beans and legumes: 2 or more servings a week Nuts and seeds: 1-2 servings a day Whole grains: 6-8 servings a day Extra-virgin olive oil: 3-4 servings a day Limit red meat and sweets to only a few servings a month What are my food choices? Mediterranean diet Recommended Grains: Whole-grain pasta. Brown rice. Bulgar wheat. Polenta. Couscous. Whole-wheat bread. Orpah Cobb. Vegetables: Artichokes. Beets. Broccoli. Cabbage. Carrots. Eggplant. Green beans. Chard. Kale. Spinach. Onions. Leeks. Peas. Squash. Tomatoes. Peppers. Radishes. Fruits: Apples. Apricots. Avocado. Berries. Bananas. Cherries. Dates. Figs. Grapes. Lemons. Melon. Oranges. Peaches. Plums. Pomegranate. Meats and other protein foods: Beans. Almonds. Sunflower seeds. Pine nuts. Peanuts. Cod.  Salmon. Scallops. Shrimp. Tuna. Tilapia. Clams. Oysters. Eggs. Dairy: Low-fat milk. Cheese. Greek yogurt. Beverages: Water. Red wine. Herbal tea. Fats and oils: Extra virgin olive oil. Avocado oil. Grape seed oil. Sweets and desserts: [place] yogurt with honey. Baked apples. Poached pears. Trail mix. Seasoning and other foods: Basil. Cilantro. Coriander. Cumin. Mint. Parsley. Sage. Rosemary. Tarragon. Garlic. Oregano. Thyme. Pepper. Balsalmic vinegar. Tahini. Hummus. Tomato sauce. Olives. Mushrooms. Limit these Grains: Prepackaged pasta or rice dishes. Prepackaged cereal with added sugar. Vegetables: Deep fried potatoes (french fries). Fruits: Fruit canned in syrup. Meats and other protein foods: Beef. Pork. Lamb. Poultry with skin. Hot dogs. Tomasa Blase. Dairy: Ice cream. Sour cream. Whole milk. Beverages: Juice. Sugar-sweetened soft drinks. Beer. Liquor and spirits. Fats and oils: Butter. Canola oil. Vegetable oil. Beef fat (tallow). Lard. Sweets and desserts: Cookies. Cakes. Pies. Candy. Seasoning and other foods: Mayonnaise. Premade sauces and marinades. The items listed may not be a complete list. Talk with your dietitian about what dietary choices are right for you. Summary The Mediterranean diet includes both food and lifestyle choices. Eat a variety of fresh fruits and vegetables, beans, nuts, seeds, and whole grains.  Limit the amount of red meat and sweets that you eat. Talk with your health care provider about whether it is safe for you to drink red wine in moderation. This means 1 glass a day for nonpregnant women and 2 glasses a day for men. A glass of wine equals 5 oz (150 mL). This information is not intended to replace advice given to you by your health care provider. Make sure you discuss any questions you have with your health care provider. Document Released: 07/15/2016 Document Revised: 08/17/2016 Document Reviewed: 07/15/2016 Elsevier Interactive Patient Education  2017 [employer].    [address] [facility] [phone]

## 2023-07-15 NOTE — Progress Notes (Signed)
B12 is on the lower normal, recommend starting supplements with 1000 mcg daily.  Follow-up with primary doctor.  Thyroid levels are normal.  Thank you

## 2023-07-19 DIAGNOSIS — M1990 Unspecified osteoarthritis, unspecified site: Secondary | ICD-10-CM | POA: Diagnosis not present

## 2023-07-19 DIAGNOSIS — E119 Type 2 diabetes mellitus without complications: Secondary | ICD-10-CM | POA: Diagnosis not present

## 2023-07-19 DIAGNOSIS — F03B0 Unspecified dementia, moderate, without behavioral disturbance, psychotic disturbance, mood disturbance, and anxiety: Secondary | ICD-10-CM | POA: Diagnosis not present

## 2023-07-19 DIAGNOSIS — K219 Gastro-esophageal reflux disease without esophagitis: Secondary | ICD-10-CM | POA: Diagnosis not present

## 2023-07-19 DIAGNOSIS — Z87891 Personal history of nicotine dependence: Secondary | ICD-10-CM | POA: Diagnosis not present

## 2023-07-21 DIAGNOSIS — M1990 Unspecified osteoarthritis, unspecified site: Secondary | ICD-10-CM | POA: Diagnosis not present

## 2023-07-21 DIAGNOSIS — Z87891 Personal history of nicotine dependence: Secondary | ICD-10-CM | POA: Diagnosis not present

## 2023-07-21 DIAGNOSIS — K219 Gastro-esophageal reflux disease without esophagitis: Secondary | ICD-10-CM | POA: Diagnosis not present

## 2023-07-21 DIAGNOSIS — E119 Type 2 diabetes mellitus without complications: Secondary | ICD-10-CM | POA: Diagnosis not present

## 2023-07-21 DIAGNOSIS — F03B0 Unspecified dementia, moderate, without behavioral disturbance, psychotic disturbance, mood disturbance, and anxiety: Secondary | ICD-10-CM | POA: Diagnosis not present

## 2023-07-26 DIAGNOSIS — Z87891 Personal history of nicotine dependence: Secondary | ICD-10-CM | POA: Diagnosis not present

## 2023-07-26 DIAGNOSIS — K219 Gastro-esophageal reflux disease without esophagitis: Secondary | ICD-10-CM | POA: Diagnosis not present

## 2023-07-26 DIAGNOSIS — F03B0 Unspecified dementia, moderate, without behavioral disturbance, psychotic disturbance, mood disturbance, and anxiety: Secondary | ICD-10-CM | POA: Diagnosis not present

## 2023-07-26 DIAGNOSIS — M1990 Unspecified osteoarthritis, unspecified site: Secondary | ICD-10-CM | POA: Diagnosis not present

## 2023-07-26 DIAGNOSIS — E119 Type 2 diabetes mellitus without complications: Secondary | ICD-10-CM | POA: Diagnosis not present

## 2023-08-02 DIAGNOSIS — K219 Gastro-esophageal reflux disease without esophagitis: Secondary | ICD-10-CM | POA: Diagnosis not present

## 2023-08-02 DIAGNOSIS — F03B0 Unspecified dementia, moderate, without behavioral disturbance, psychotic disturbance, mood disturbance, and anxiety: Secondary | ICD-10-CM | POA: Diagnosis not present

## 2023-08-02 DIAGNOSIS — M1990 Unspecified osteoarthritis, unspecified site: Secondary | ICD-10-CM | POA: Diagnosis not present

## 2023-08-02 DIAGNOSIS — Z87891 Personal history of nicotine dependence: Secondary | ICD-10-CM | POA: Diagnosis not present

## 2023-08-02 DIAGNOSIS — E119 Type 2 diabetes mellitus without complications: Secondary | ICD-10-CM | POA: Diagnosis not present

## 2023-08-05 ENCOUNTER — Ambulatory Visit
Admission: RE | Admit: 2023-08-05 | Discharge: 2023-08-05 | Disposition: A | Discharge status: Home / Self Care | Payer: 59 | Source: Ambulatory Visit | Attending: Physician Assistant | Admitting: Physician Assistant

## 2023-08-05 DIAGNOSIS — G319 Degenerative disease of nervous system, unspecified: Secondary | ICD-10-CM | POA: Diagnosis not present

## 2023-08-05 DIAGNOSIS — R9082 White matter disease, unspecified: Secondary | ICD-10-CM | POA: Diagnosis not present

## 2023-08-05 DIAGNOSIS — R413 Other amnesia: Secondary | ICD-10-CM | POA: Diagnosis not present

## 2023-08-05 DIAGNOSIS — I6389 Other cerebral infarction: Secondary | ICD-10-CM | POA: Diagnosis not present

## 2023-08-11 ENCOUNTER — Encounter: Payer: Self-pay | Admitting: Physician Assistant

## 2023-08-12 DIAGNOSIS — Z87891 Personal history of nicotine dependence: Secondary | ICD-10-CM | POA: Diagnosis not present

## 2023-08-12 DIAGNOSIS — M1990 Unspecified osteoarthritis, unspecified site: Secondary | ICD-10-CM | POA: Diagnosis not present

## 2023-08-12 DIAGNOSIS — K219 Gastro-esophageal reflux disease without esophagitis: Secondary | ICD-10-CM | POA: Diagnosis not present

## 2023-08-12 DIAGNOSIS — E119 Type 2 diabetes mellitus without complications: Secondary | ICD-10-CM | POA: Diagnosis not present

## 2023-08-18 DIAGNOSIS — Z87891 Personal history of nicotine dependence: Secondary | ICD-10-CM | POA: Diagnosis not present

## 2023-08-18 DIAGNOSIS — K219 Gastro-esophageal reflux disease without esophagitis: Secondary | ICD-10-CM | POA: Diagnosis not present

## 2023-08-18 DIAGNOSIS — E119 Type 2 diabetes mellitus without complications: Secondary | ICD-10-CM | POA: Diagnosis not present

## 2023-08-18 DIAGNOSIS — M1990 Unspecified osteoarthritis, unspecified site: Secondary | ICD-10-CM | POA: Diagnosis not present

## 2023-08-25 ENCOUNTER — Encounter: Payer: Self-pay | Admitting: Physician Assistant

## 2023-08-25 ENCOUNTER — Telehealth: Payer: Self-pay | Admitting: Physician Assistant

## 2023-08-25 ENCOUNTER — Telehealth (INDEPENDENT_AMBULATORY_CARE_PROVIDER_SITE_OTHER): Payer: 59 | Admitting: Physician Assistant

## 2023-08-25 DIAGNOSIS — I771 Stricture of artery: Secondary | ICD-10-CM

## 2023-08-25 DIAGNOSIS — Z8673 Personal history of transient ischemic attack (TIA), and cerebral infarction without residual deficits: Secondary | ICD-10-CM

## 2023-08-25 NOTE — Telephone Encounter (Signed)
Spoke with the patient's daughter to discuss in detail the MRI of the brain findings.  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  We explained in detail, that the stroke is old, however she is not on any anticoagulation.  She has a history of DM, hypertension and hyperlipidemia, which adds risk for a future stroke if not well monitored.  While she is entertaining following up with cardiology regarding her   risk factors, it is felt prudent to proceed with MRA of the head and neck to look in detail vascular load and any other areas of stenosis or occlusion by imaging. Daughter would prefer to have MRA rather than CT angio, as these more details. In the meantime, recommend to closely monitor her blood pressure, cholesterol and A1c, and if no acute bleeding, to consider daily baby aspirin.  Patient may be in the process of the CNA cardiologist for management of high blood pressure.  In addition, incidentally per MRI of the brain, C3-C4 stenoses was noted, which will require further imaging.  Will order a cervical MRI of the C-spine with and without contrast to further study the areas of flattening of the ventral spinal cord and other abnormalities.  Pending on the results, this may require referral to orthopedics or spine specialist. A total of 34 minutes was spent with the patient's family discussing all of the findings and all of their concerns were entertained to their satisfaction.  Family was very Adult nurse.

## 2023-08-25 NOTE — Telephone Encounter (Signed)
Called her son, no answer. Left vm to call us back

## 2023-08-25 NOTE — Progress Notes (Signed)
I called patient home to discuss any questions they had and there was no answer, left vm.

## 2023-08-25 NOTE — Telephone Encounter (Signed)
Patient's Daughter inlaw and SON have a lot of questions concerning the MRI.

## 2023-08-26 NOTE — Telephone Encounter (Signed)
I order mra of head and neck with and wo contrast and mri of the cspine. Sent to [facility].

## 2023-08-26 NOTE — Addendum Note (Signed)
Addended by: Allean Found R on: 08/26/2023 08:03 AM   Modules accepted: Orders

## 2023-08-30 DIAGNOSIS — R350 Frequency of micturition: Secondary | ICD-10-CM | POA: Diagnosis not present

## 2023-08-30 DIAGNOSIS — Z8673 Personal history of transient ischemic attack (TIA), and cerebral infarction without residual deficits: Secondary | ICD-10-CM | POA: Diagnosis not present

## 2023-08-30 DIAGNOSIS — Z136 Encounter for screening for cardiovascular disorders: Secondary | ICD-10-CM | POA: Diagnosis not present

## 2023-09-02 ENCOUNTER — Other Ambulatory Visit: Payer: Self-pay | Admitting: Physician Assistant

## 2023-09-02 MED ORDER — DIAZEPAM 2 MG PO TABS: ORAL_TABLET | ORAL | 0 refills | Status: DC

## 2023-09-06 ENCOUNTER — Encounter: Payer: Self-pay | Admitting: Podiatry

## 2023-09-06 ENCOUNTER — Ambulatory Visit (INDEPENDENT_AMBULATORY_CARE_PROVIDER_SITE_OTHER): Payer: 59 | Admitting: Podiatry

## 2023-09-06 DIAGNOSIS — E119 Type 2 diabetes mellitus without complications: Secondary | ICD-10-CM | POA: Diagnosis not present

## 2023-09-06 DIAGNOSIS — M79674 Pain in right toe(s): Secondary | ICD-10-CM | POA: Diagnosis not present

## 2023-09-06 DIAGNOSIS — M79675 Pain in left toe(s): Secondary | ICD-10-CM | POA: Diagnosis not present

## 2023-09-06 DIAGNOSIS — L84 Corns and callosities: Secondary | ICD-10-CM

## 2023-09-06 DIAGNOSIS — B351 Tinea unguium: Secondary | ICD-10-CM | POA: Diagnosis not present

## 2023-09-06 NOTE — Progress Notes (Signed)
  Subjective:  Patient ID: Anita Medina, female    DOB: [birth year],   MRN: [id number]  No chief complaint on file.   82 y.o. female presents for concern of thickened elongated and painful nails that are difficult to trim. Requesting to have them trimmed today. Relates burning and tingling in their feet. Patient is diabetic and last A1c was  Lab Results  Component Value Date   HGBA1C 6.5 01/24/2019   .   PCP:  Thana Ates, MD    . Denies any other pedal complaints. Denies n/v/f/c.   No past medical history on file.  Objective:  Physical Exam: Vascular: DP/PT pulses 2/4 bilateral. CFT <3 seconds. Absent hair growth on digits. Edema noted to bilateral lower extremities. Xerosis noted bilaterally.  Skin. No lacerations or abrasions bilateral feet. Nails 1-5 bilateral  are thickened discolored and elongated with subungual debris. Hyperkeratotic lesions noted sub first metatarsal and fifth metatarsal bilateral and sub right heel.  Musculoskeletal: MMT 5/5 bilateral lower extremities in DF, PF, Inversion and Eversion. Deceased ROM in DF of ankle joint.  Neurological: Sensation intact to light touch. Protective sensation diminished bilateral.    Assessment:   1. Pain due to onychomycosis of toenails of both feet   2. Controlled type 2 diabetes mellitus without complication, without long-term current use of insulin (HCC)      Plan:  Patient was evaluated and treated and all questions answered. -Discussed and educated patient on diabetic foot care, especially with  regards to the vascular, neurological and musculoskeletal systems.  -Stressed the importance of good glycemic control and the detriment of not  controlling glucose levels in relation to the foot. -Discussed supportive shoes at all times and checking feet regularly.  -Mechanically debrided all nails 1-5 bilateral using sterile nail nipper and filed with dremel without incident  -Hyperkeratotic tissue x 5 debrided without  incident with chisel.  -Answered all patient questions -Patient to return  in 3 months for at risk foot care -Patient advised to call the office if any problems or questions arise in the meantime.   Louann Sjogren, DPM

## 2023-09-19 ENCOUNTER — Ambulatory Visit
Admission: RE | Admit: 2023-09-19 | Discharge: 2023-09-19 | Disposition: A | Discharge status: Home / Self Care | Payer: 59 | Source: Ambulatory Visit | Attending: Physician Assistant | Admitting: Physician Assistant

## 2023-09-19 DIAGNOSIS — M4802 Spinal stenosis, cervical region: Secondary | ICD-10-CM | POA: Diagnosis not present

## 2023-09-19 DIAGNOSIS — I6389 Other cerebral infarction: Secondary | ICD-10-CM | POA: Diagnosis not present

## 2023-09-19 MED ORDER — GADOPICLENOL 0.5 MMOL/ML IV SOLN
6.0000 mL | Freq: Once | INTRAVENOUS | Status: AC | PRN
  Administered 2023-09-19: 6 mL via INTRAVENOUS

## 2023-09-27 NOTE — Progress Notes (Signed)
MRI of the C spine (neck), shows some disc disease, narrowing of some of the canal . If you have an Ortho or Neurosurgeon you see, we can refer for further evaluation, thanks

## 2023-09-27 NOTE — Progress Notes (Signed)
MR angio of the head and neck without occlusion and the circulation  in the neck is patent ,  no occlusion, no aneurysm

## 2023-09-28 NOTE — Progress Notes (Signed)
Mychart message sent.

## 2023-09-29 NOTE — Progress Notes (Signed)
Mychart message sent, awaiting reply

## 2023-10-06 ENCOUNTER — Telehealth: Payer: Self-pay | Admitting: Physician Assistant

## 2023-10-06 NOTE — Telephone Encounter (Signed)
Son called requesting to speak with [place]. Advised him that he would receive a call back.

## 2023-10-06 NOTE — Telephone Encounter (Signed)
I left message to call office back to see about the referral

## 2023-10-07 NOTE — Telephone Encounter (Signed)
He is going to call me  back to discuss this, this afternoon  10/07/2023 at 2:06pm. When he calls back please transfer to me.

## 2023-10-12 DIAGNOSIS — R3121 Asymptomatic microscopic hematuria: Secondary | ICD-10-CM | POA: Diagnosis not present

## 2023-10-14 DIAGNOSIS — E7800 Pure hypercholesterolemia, unspecified: Secondary | ICD-10-CM | POA: Diagnosis not present

## 2023-10-14 DIAGNOSIS — Z79899 Other long term (current) drug therapy: Secondary | ICD-10-CM | POA: Diagnosis not present

## 2023-10-14 DIAGNOSIS — Z23 Encounter for immunization: Secondary | ICD-10-CM | POA: Diagnosis not present

## 2023-10-14 DIAGNOSIS — M1711 Unilateral primary osteoarthritis, right knee: Secondary | ICD-10-CM | POA: Diagnosis not present

## 2023-10-17 ENCOUNTER — Other Ambulatory Visit: Payer: Self-pay | Admitting: Urology

## 2023-10-17 DIAGNOSIS — R3121 Asymptomatic microscopic hematuria: Secondary | ICD-10-CM

## 2023-10-21 ENCOUNTER — Ambulatory Visit: Payer: 59 | Admitting: Physician Assistant

## 2023-10-31 DIAGNOSIS — H43813 Vitreous degeneration, bilateral: Secondary | ICD-10-CM | POA: Diagnosis not present

## 2023-10-31 DIAGNOSIS — H40023 Open angle with borderline findings, high risk, bilateral: Secondary | ICD-10-CM | POA: Diagnosis not present

## 2023-10-31 DIAGNOSIS — H2513 Age-related nuclear cataract, bilateral: Secondary | ICD-10-CM | POA: Diagnosis not present

## 2023-11-01 ENCOUNTER — Other Ambulatory Visit: Payer: 59

## 2023-11-02 ENCOUNTER — Ambulatory Visit
Admission: RE | Admit: 2023-11-02 | Discharge: 2023-11-02 | Disposition: A | Discharge status: Home / Self Care | Payer: 59 | Source: Ambulatory Visit | Attending: Urology | Admitting: Urology

## 2023-11-02 DIAGNOSIS — R3121 Asymptomatic microscopic hematuria: Secondary | ICD-10-CM

## 2023-11-02 MED ORDER — IOPAMIDOL (ISOVUE-300) INJECTION 61%
500.0000 mL | Freq: Once | INTRAVENOUS | Status: AC | PRN
  Administered 2023-11-02: 125 mL via INTRAVENOUS

## 2023-11-08 ENCOUNTER — Encounter: Payer: Self-pay | Admitting: Physician Assistant

## 2023-12-13 ENCOUNTER — Ambulatory Visit: Payer: 59 | Admitting: Podiatry

## 2023-12-19 ENCOUNTER — Encounter: Payer: Self-pay | Admitting: Podiatry

## 2023-12-19 ENCOUNTER — Ambulatory Visit (INDEPENDENT_AMBULATORY_CARE_PROVIDER_SITE_OTHER): Payer: 59 | Admitting: Podiatry

## 2023-12-19 DIAGNOSIS — E119 Type 2 diabetes mellitus without complications: Secondary | ICD-10-CM | POA: Diagnosis not present

## 2023-12-19 DIAGNOSIS — L84 Corns and callosities: Secondary | ICD-10-CM

## 2023-12-19 DIAGNOSIS — M79674 Pain in right toe(s): Secondary | ICD-10-CM | POA: Diagnosis not present

## 2023-12-19 DIAGNOSIS — M79675 Pain in left toe(s): Secondary | ICD-10-CM | POA: Diagnosis not present

## 2023-12-19 DIAGNOSIS — B351 Tinea unguium: Secondary | ICD-10-CM

## 2023-12-19 NOTE — Progress Notes (Signed)
  Subjective:  Patient ID: Anita Medina, female    DOB: [birth year],   MRN: [id number]  No chief complaint on file.   83 y.o. female presents for concern of thickened elongated and painful nails that are difficult to trim. Requesting to have them trimmed today. Relates burning and tingling in their feet. Patient is diabetic and last A1c was  Lab Results  Component Value Date   HGBA1C 6.5 01/24/2019   .   PCP:  Tena Feeling, MD    . Denies any other pedal complaints. Denies n/v/f/c.   No past medical history on file.  Objective:  Physical Exam: Vascular: DP/PT pulses 2/4 bilateral. CFT <3 seconds. Absent hair growth on digits. Edema noted to bilateral lower extremities. Xerosis noted bilaterally.  Skin. No lacerations or abrasions bilateral feet. Nails 1-5 bilateral  are thickened discolored and elongated with subungual debris. Hyperkeratotic lesions noted sub first metatarsal and fifth metatarsal bilateral and sub right heel.  Musculoskeletal: MMT 5/5 bilateral lower extremities in DF, PF, Inversion and Eversion. Deceased ROM in DF of ankle joint.  Neurological: Sensation intact to light touch. Protective sensation diminished bilateral.    Assessment:   1. Pain due to onychomycosis of toenails of both feet   2. Controlled type 2 diabetes mellitus without complication, without long-term current use of insulin (HCC)   3. Callus       Plan:  Patient was evaluated and treated and all questions answered. -Discussed and educated patient on diabetic foot care, especially with  regards to the vascular, neurological and musculoskeletal systems.  -Stressed the importance of good glycemic control and the detriment of not  controlling glucose levels in relation to the foot. -Discussed supportive shoes at all times and checking feet regularly.  -Mechanically debrided all nails 1-5 bilateral using sterile nail nipper and filed with dremel without incident  -Hyperkeratotic tissue x 5  debrided without incident with chisel.  -Answered all patient questions -Patient to return  in 3 months for at risk foot care -Patient advised to call the office if any problems or questions arise in the meantime.   Jennefer Moats, DPM

## 2024-02-13 ENCOUNTER — Ambulatory Visit: Payer: Self-pay | Admitting: Psychology

## 2024-02-13 ENCOUNTER — Encounter: Payer: Self-pay | Admitting: Psychology

## 2024-02-13 ENCOUNTER — Ambulatory Visit (INDEPENDENT_AMBULATORY_CARE_PROVIDER_SITE_OTHER): Payer: No Typology Code available for payment source | Admitting: Psychology

## 2024-02-13 DIAGNOSIS — R4189 Other symptoms and signs involving cognitive functions and awareness: Secondary | ICD-10-CM

## 2024-02-13 DIAGNOSIS — F03A0 Unspecified dementia, mild, without behavioral disturbance, psychotic disturbance, mood disturbance, and anxiety: Secondary | ICD-10-CM

## 2024-02-13 DIAGNOSIS — R413 Other amnesia: Secondary | ICD-10-CM | POA: Diagnosis not present

## 2024-02-13 NOTE — Progress Notes (Unsigned)
 NEUROPSYCHOLOGICAL EVALUATION [place]. [facility]  [facility] Department of Neurology  Date of Evaluation: 02/13/2024  REASON FOR REFERRAL   Anita Medina is an 83 year old, right-handed, Black female with 11 years of formal education. She was referred for neuropsychological evaluation by Marlowe Kays, PA-C, to assess current neurocognitive functioning, document potential cognitive deficits, and assist with treatment planning. This is her first neuropsychological evaluation.  SUMMARY OF RESULTS   Premorbid cognitive abilities are estimated to be in the low average range based on word reading and sociodemographic. Given this estimate, the patient demonstrated impairments on tasks of memory, visuospatial skills, confrontation naming, semantic fluency, divided attention/set shifting, and rapid decoding. Regarding memory, she demonstrated impaired encoding, recall, and recognition for both verbal and visual information. She did not appear to benefit from repetition of the information, and she rapidly forgot information after a delay (i.e., scores of zero on all delayed recall tasks). Even with the provision of recognition cues, scores remained largely reduced to impaired. In comparison, performance was within expectations on tasks of attention, visual scanning, phonemic fluency, and judgment. On self-report questionnaires, she did not endorse clinically-elevated levels of depression or anxiety.  DIAGNOSTIC IMPRESSION   Results of the current evaluation indicated cognitive impairment in multiple domains in the setting of reduced functional independence. 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 The constellation of symptoms--such as insidious onset, anosognosia, disorientation, and widespread cognitive impairment (particularly rapid forgetting, difficulties with access to semantic knowledge, and poor visuospatial skills)--raises significant concern for an underlying  Alzheimer's disease process as the primary etiology. However, contribution from concomitant cerebrovascular disease cannot be ruled out. Additional neurological workup (e.g., CSF analysis, amyloid imaging) may be considered in clarifying the underlying etiology and adapting the treatment plan over time.  ICD-10 Codes: F03.A0 Mild dementia; R41.3 Memory loss  RECOMMENDATIONS   A repeat neuropsychological evaluation in one year (or sooner if functional decline is noted) is recommended.  Discuss additional workup (e.g., amyloid imaging, CSF analysis) with neurology to further clarify the etiology of cognitive impairments identified today.   Patient reported that she is not taking the donepezil prescribed to her due to side effects. She should discuss alternative options with neurology for medication aimed at addressing memory loss and concern surrounding Alzheimer's disease. It is important to highlight that this medication has been shown to slow functional decline in some individuals.  Continue to have trusted family members assist with management of medications, finances, and medical appointments to ensure that errors do not occur.  Patient is encouraged to continue managing vascular risk factors through a heart healthy diet, physician-approved physical activity, and medication adherence.   She is currently engaged in several enjoyable and stimulating activities outside of her home. She is encouraged to continue pursuing these activities, as social activity is important for maintaining cognitive function and emotional wellbeing, particularly in individuals with neurocognitive conditions.  Patient was encouraged to establish legal documents designating advanced healthcare directives and durable powers of attorney. In times of crisis or further cognitive decline, having these documents in place can reduce the risk for financial victimization or mistakes and can ease potential family conflict related  to finances or health care decisions.   Memory can be improved using internal strategies such as rehearsal, repetition, chunking, mnemonics, association, and imagery. External strategies such as written notes in a consistently used memory journal, visual and nonverbal auditory cues such as a calendar on the refrigerator or appointments with alarm, such as on a  cell phone, can also help maximize recall.  Break down complex tasks into smaller components. Focus on one activity at a time and complete each task before starting another. Minimize background distractions, especially when attempting to learn new information or when working on important tasks.  If interested, there are some activities which have therapeutic value and can be useful in keeping her cognitively stimulated. For suggestions, she is encouraged to go to the following website: https://www.barrow[facility].org/get-to-know-barrow/centers-programs/neurorehabilitation-center/neuro-rehab-apps-and-games/   For additional resources, the patient and her family may wish to explore organizations, such as the Alzheimer's Association (LimitLaws[employer]hu), which provide valuable educational materials on Alzheimer's disease and other dementias, as well as support strategies for caregivers, tools to understand symptoms and treatment options, and information on local chapters and community support networks.  DISPOSITION   Patient will follow up with the referring provider, Ms. Wertman. Patient should return for repeat neuropsychological testing in one year to monitor her course and assist with diagnosis and treatment planning. She and her family will be provided verbal feedback in approximately one week regarding the findings and impression during this visit.  The remainder of the report includes the details of the patient's background and a table of results from the current evaluation, which support the summary and recommendations described above.  BACKGROUND    History of Presenting Illness: The following information was obtained following a review of medical records and an interview with the patient and her son, Christiane Ha. Patient was evaluated by neurology on 07/15/2023 due to reports of memory loss. MoCA = 14/30. During that appointment, family raised concerns for short-term memory difficulties over the past 2-3 years that have worsened within the last year.  Cognitive Functioning: During today's appointment, the patient reported minimal cognitive concerns aside from very mild and very occasional short-term memory difficulties and reduced processing speed. In comparison, her son has observed more substantial short-term memory problems, difficulties with executive functioning (e.g., planning, organizing, problem solving), and variable attention. Regarding short-term memory, he reported that the patient forgets details of conversations/events and repeats herself. She has been misplacing items (e.g., pocketbook, ID card) more often. He has also observed increased difficulty with routine activities. He denied concerns with word finding, comprehension, and navigation. He first appreciated cognitive changes approximately two years ago and reported that the course has been somewhat progressive but also somewhat variable (e.g., she is more clear some days).  Physical Functioning: Patient denied difficulties with sleep initiation and maintenance. Appetite is stable. No changes to sense of taste or smell were reported. Vision and hearing are stable. Patient alternates between using a cane and a walker on days when she feels unsteady. She denied falls.  Emotional Functioning: Patient reported her current mood as stable. She endorsed feeling happy to be alive and hopeful for her future. She denied suicidal ideation. Her son has observed some increased frustration over the past eight months related to a loss of sense of control. Patient remains socially engaged. She goes to  [facility] a couple of times per week, as she enjoys the activities (e.g., arts and crafts, exercise, memory games) and the socializing aspect. She also stays busy during the week with church activities, reading, watching television, and playing brain games.  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  Other Relevant Medical History: Remarkable for type 2 diabetes (controlled without medication) and  hyperlipidemia. No history of stroke, CNS infection, head injury, or seizure was reported.  Current Medications: Per patient, rosuvastatin and vitamins. She reported that she is not taking donepezil at this time.   Functional Status: Patient independently performs ADLs, though her son mentions some "stubbornness" with certain hygiene activities. Patient has a home health aide who visits three times per week and has been working with her for about a year. Aide assists with hygiene, meal preparation, and cleaning. Patient reported that she is able to prepare meals. Her son confirmed this but noted that most meals are simple, as the patient only needs to prepare food for herself. Family took over the management of finances due to errors. Patient independently manages her medication, though her son often checks in to ensure she is taking medications as prescribed. She decided to stop driving years ago (i.e., before ever experiencing cognitive concerns), and denied any accidents, citations, or navigation issues prior to making that decision.  Family Neurological History: Remarkable for Alzheimer's disease (maternal aunt).  Psychiatric History: History of depression, anxiety, prior mental health treatment, suicidal ideation, hallucinations, and psychiatric hospitalizations was not reported.  Substance Use  History: Patient denied current use of alcohol, nicotine, marijuana, and illicit substances. She was a former cigarette smoker but was unable to recall how much she smoked on average or when she quit. Medical records documented significant alcohol use in the patient's 19, but the patient and her son denied this.  Social and Developmental History: Patient was born in [place], [place]. History of perinatal complications and developmental delays was not reported. Patient is divorced and has two children. She resides alone but is visited by a home health aide three times per week.  Educational and Occupational History: No history of childhood learning disability, special education services, or grade retention was reported. Patient completed the 11th grade. 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  BEHAVIORAL OBSERVATIONS   Patient arrived on time and was accompanied by her son, Christiane Ha. She ambulated with a cane and gait was slowed. After the clinical interview, she began feeling unsteady when she stood up to move to the testing room, and her son had to assist her in walking there. She was alert and oriented to date of birth and city but not to time (i.e., stated it was November 2000 but could not recall the exact year), place, or age (i.e., stated she was in her 60s). She was appropriately groomed and dressed for the setting. No significant sensory or motor abnormalities were observed. Vision and hearing were adequate for testing purposes. Speech was occasionally repetitive but otherwise of normal rate, prosody, and volume. No conversational word-finding difficulties, paraphasic errors, or dysarthria were observed. Comprehension was conversationally intact. Thought processes were linear, logical, and coherent. Thought content was organized and devoid of delusions. Insight appeared reduced. Affect was even and congruent with euthymic mood. She was  cooperative and gave adequate effort during testing, including on embedded measures of performance validity. Results are thought to accurately reflect her cognitive functioning at this time. Of note, she required repetition and clarification for several test instructions. During a phonemic fluency task, she forgot the new letter provided and reverted to listing words starting with the previous letter. She was also observed to have word-finding difficulties in conversation and on confrontation naming tasks.  NEUROPSYCHOLOGICAL TESTING RESULTS   Tests Administered: Clock Drawing; Controlled Oral Word Association Test (COWAT): FAS; Dementia Rating Scale-2 (DRS-2) -  Standard Form; Geriatric Anxiety Scale-10 Item (GAS-10); Geriatric Depression Scale - Short Form (GDS-SF); Neuropsychological Assessment Battery (NAB) Form 1 - Subtest(s): Naming, Judgement; Repeatable Battery for the Assessment of Neuropsychological Status Update (RBANS Update) - Form A; Test of Premorbid Functioning (TOPF); and Trail Making Test (TMT).  Test results are provided in the table below. Whenever possible, the patient's scores were compared against age-, sex-, and education-corrected normative samples. Interpretive descriptions are based on the AACN consensus conference statement on uniform labeling (Guilmette et al., 2020).  COGNITIVE SCREENING RAW  RANGE  DRS-2 Total Score 107 ss=5 Below Average  DRS-2 Attention 36 ss=12 High Average  DRS-2 Initiation/Perseveration 23 ss=3 Exceptionally Low  DRS-2 Construction 6 ss=12 High Average  DRS-2 Conceptualization 33 ss=10 Average  DRS-2 Memory 9 ss=3 Exceptionally Low  *From MOAANS (2005)     RBANS Total Score -- StdS=56 Exceptionally Low  RBANS Immediate Memory -- StdS=61 Exceptionally Low  RBANS Visuospatial/Constructional -- StdS=66 Exceptionally Low  RBANS Language -- StdS=64 Exceptionally Low  RBANS Attention -- StdS=91 Average  RBANS Delayed Memory -- StdS=48 Exceptionally  Low  PREMORBID FUNCTIONING RAW  RANGE  TOPF 16 StdS=80 Low Average  ATTENTION & PROCESSING SPEED RAW  RANGE  RBANS Attention -- StdS=91 Average  Digit Span -- ss=12 High Average  Coding -- ss=5 Below Average  Trails A 70''1e T=45 Average  EXECUTIVE FUNCTION RAW  RANGE  Trails B D/C -- --  COWAT Letter Fluency 6+2+10 T=46 Average  NAB Judgement -- T=59 High Average  LANGUAGE RAW  RANGE  RBANS Language -- StdS=64 Exceptionally Low  Picture Naming 7/10 <2%ile Exceptionally Low  Semantic Fluency 10 ss=4 Below Average  COWAT Letter Fluency 6+2+10 T=46 Average  NAB Naming Test 17/31 T=24 BNL  VISUOSPATIAL RAW  RANGE  RBANS Visuospatial/Constructional -- StdS=66 Exceptionally Low  Figure Copy 14/20 ss=5 Below Average  Line Orientation -- <2%ile Exceptionally Low  Clock Drawing 6/10 -- BNL  VERBAL LEARNING & MEMORY RAW  RANGE  RBANS Word List -- -- --  List Learning 3+4+4+4 ss=4 Below Average  List Recall 0/10 <2%ile Exceptionally Low  List Recognition 14/20 <2%ile Exceptionally Low  RBANS Story -- -- --  Story Memory 2+4 ss=3 Exceptionally Low  Story Recall 0/12 ss=1 Exceptionally Low  Story Recognition 7/12 7-13%ile Below Average to Low Average  VISUOSPATIAL LEARNING & MEMORY RAW  RANGE  RBANS Figure -- -- --  Figure Copy 14/20 ss=5 Below Average  Figure Recall 0/20 ss=1 Exceptionally Low  Figure Recognition 1/8 <1%ile Exceptionally Low  QUESTIONNAIRES RAW  RANGE  GDS-SF 0 -- Minimal  GAS-10 1 -- Minimal  *Note: ss = scaled score; StdS = standard score; T = t-score; C/S = corrected raw score; WNL = within normal limits; BNL= below normal limits; D/C = discontinued. Scores from skewed distributions are typically interpreted as WNL (>=16th %ile) or BNL (<16th %ile).   INFORMED CONSENT   Patient was provided with a verbal description of the nature and purpose of the neuropsychological evaluation. Also reviewed were the foreseeable risks and/or discomforts and benefits of the  procedure, limits of confidentiality, and mandatory reporting requirements of this provider. Patient was given the opportunity to have their questions answered. Oral consent to participate was provided by the patient.   This report was prepared as part of a clinical evaluation and is not intended for forensic use.  SERVICE   This evaluation was conducted by Annice Pih, Psy.D. In addition to time spent directly with the patient, total professional time  includes record review, integration of relevant medical history, test selection, interpretation of findings, and report preparation. A technician, Wallace Keller, B.S., provided testing and scoring assistance for 115 minutes.  Psychiatric Diagnostic Evaluation Services (Professional): [id number] x 1 Neuropsychological Testing Evaluation Services (Professional): 29562 x 1 Neuropsychological Testing Evaluation Services (Professional): 13086 x 2 Neuropsychological Test Administration and Scoring (Technician): [phone] x 1 Neuropsychological Test Administration and Scoring (Technician): [phone] x 3  This report was generated using voice recognition software. While this document has been carefully reviewed, transcription errors may be present. I apologize in advance for any inconvenience. Please contact me if further clarification is needed.            Annice Pih, Psy.D.             Neuropsychologist

## 2024-02-13 NOTE — Progress Notes (Signed)
   Psychometrician Note   Cognitive testing was administered to Anita Medina by Wallace Keller, B.S. (psychometrist) under the supervision of Dr. Annice Pih, Psy.D., licensed psychologist on 02/13/2024. Anita Medina did not appear overtly distressed by the testing session per behavioral observation or responses across self-report questionnaires. Rest breaks were offered.    The battery of tests administered was selected by Dr. Annice Pih, Psy.D. with consideration to Anita Medina's current level of functioning, the nature of her symptoms, emotional and behavioral responses during interview, level of literacy, observed level of motivation/effort, and the nature of the referral question. This battery was communicated to the psychometrist. Communication between Dr. Annice Pih, Psy.D. and the psychometrist was ongoing throughout the evaluation and Dr. Annice Pih, Psy.D. was immediately accessible at all times. Dr. Annice Pih, Psy.D. provided supervision to the psychometrist on the date of this service to the extent necessary to assure the quality of all services provided.    Anita Medina will return within approximately 1-2 weeks for an interactive feedback session with Dr. Robbie Lis at which time her test performances, clinical impressions, and treatment recommendations will be reviewed in detail. Anita Medina understands she can contact our office should she require our assistance before this time.  A total of 115 minutes of billable time were spent face-to-face with Anita Medina by the psychometrist. This includes both test administration and scoring time. Billing for these services is reflected in the clinical report generated by Dr. Annice Pih, Psy.D.  This note reflects time spent with the psychometrician and does not include test scores or any clinical interpretations made by Dr. Robbie Lis. The full report will follow in a separate note.

## 2024-02-14 DIAGNOSIS — E7800 Pure hypercholesterolemia, unspecified: Secondary | ICD-10-CM | POA: Diagnosis not present

## 2024-02-14 DIAGNOSIS — Z8673 Personal history of transient ischemic attack (TIA), and cerebral infarction without residual deficits: Secondary | ICD-10-CM | POA: Diagnosis not present

## 2024-02-14 DIAGNOSIS — R3129 Other microscopic hematuria: Secondary | ICD-10-CM | POA: Diagnosis not present

## 2024-02-20 ENCOUNTER — Ambulatory Visit: Payer: No Typology Code available for payment source | Admitting: Psychology

## 2024-02-20 DIAGNOSIS — R413 Other amnesia: Secondary | ICD-10-CM

## 2024-02-20 DIAGNOSIS — F03A0 Unspecified dementia, mild, without behavioral disturbance, psychotic disturbance, mood disturbance, and anxiety: Secondary | ICD-10-CM | POA: Diagnosis not present

## 2024-02-20 NOTE — Progress Notes (Signed)
   NEUROPSYCHOLOGY FEEDBACK SESSION [place]. [facility]  [place] Department of Neurology  Date of Feedback Session: 02/20/2024  REASON FOR REFERRAL   Anita Medina is an 83 year old, right-handed, Black female with 11 years of formal education. She was referred for neuropsychological evaluation by Marlowe Kays, PA-C, to assess current neurocognitive functioning, document potential cognitive deficits, and assist with treatment planning.  FEEDBACK   Patient completed a comprehensive neuropsychological evaluation on 02/13/2024. Please refer to that encounter for the full report and recommendations. Briefly, results indicated cognitive impairment in multiple domains in the setting of reduced functional independence, which is consistent with a diagnosis of mild dementia. The constellation of symptoms--such as insidious onset, anosognosia, disorientation, and widespread cognitive impairment (particularly rapid forgetting, difficulties with access to semantic knowledge, and poor visuospatial skills)--is concerning for an underlying Alzheimer's disease process as the primary etiology. However, contribution from concomitant cerebrovascular disease cannot be ruled out. Additional neurological workup (e.g., CSF analysis, amyloid imaging) may be considered in clarifying the underlying etiology and adapting the treatment plan over time.  Today, the patient was accompanied by her daughter-in-law during the telephone call. Patient answered the call from her home while I was in my office. Verbal feedback was given regarding the findings and impression during this visit, and all questions were answered. Patient and her daughter-in-law were informed that a copy of the report was available to be accessed in the online patient portal. They were encouraged to reach out should additional questions arise.  DISPOSITION   Patient will follow up with the referring provider, Ms. Wertman. Patient should return for  repeat neuropsychological testing in one year to monitor her course and assist with diagnosis and treatment planning.  SERVICE   This feedback session was conducted by Annice Pih, Psy.D. [place] [address] was billed for Dr. Robbie Lis' time spent in preparing, conducting, and documenting the current feedback session.  This report was generated using voice recognition software. While this document has been carefully reviewed, transcription errors may be present. I apologize in advance for any inconvenience. Please contact me if further clarification is needed.

## 2024-03-06 ENCOUNTER — Other Ambulatory Visit: Payer: Self-pay | Admitting: Internal Medicine

## 2024-03-06 DIAGNOSIS — Z1231 Encounter for screening mammogram for malignant neoplasm of breast: Secondary | ICD-10-CM

## 2024-03-14 DIAGNOSIS — N281 Cyst of kidney, acquired: Secondary | ICD-10-CM | POA: Diagnosis not present

## 2024-03-14 DIAGNOSIS — R3121 Asymptomatic microscopic hematuria: Secondary | ICD-10-CM | POA: Diagnosis not present

## 2024-03-19 ENCOUNTER — Ambulatory Visit (INDEPENDENT_AMBULATORY_CARE_PROVIDER_SITE_OTHER): Payer: 59 | Admitting: Podiatry

## 2024-03-19 DIAGNOSIS — M79675 Pain in left toe(s): Secondary | ICD-10-CM

## 2024-03-19 DIAGNOSIS — L84 Corns and callosities: Secondary | ICD-10-CM | POA: Diagnosis not present

## 2024-03-19 DIAGNOSIS — M79674 Pain in right toe(s): Secondary | ICD-10-CM

## 2024-03-19 DIAGNOSIS — E119 Type 2 diabetes mellitus without complications: Secondary | ICD-10-CM | POA: Diagnosis not present

## 2024-03-19 DIAGNOSIS — B351 Tinea unguium: Secondary | ICD-10-CM | POA: Diagnosis not present

## 2024-03-19 NOTE — Progress Notes (Signed)
  Subjective:  Patient ID: Anita Medina, female    DOB: [birth year],   MRN: [id number]  No chief complaint on file.   83 y.o. female presents for concern of thickened elongated and painful nails that are difficult to trim. Requesting to have them trimmed today. Relates burning and tingling in their feet. Patient is diabetic and last A1c was  Lab Results  Component Value Date   HGBA1C 6.5 01/24/2019   .   PCP:  Tena Feeling, MD    . Denies any other pedal complaints. Denies n/v/f/c.   No past medical history on file.  Objective:  Physical Exam: Vascular: DP/PT pulses 2/4 bilateral. CFT <3 seconds. Absent hair growth on digits. Edema noted to bilateral lower extremities. Xerosis noted bilaterally.  Skin. No lacerations or abrasions bilateral feet. Nails 1-5 bilateral  are thickened discolored and elongated with subungual debris. Hyperkeratotic lesions noted sub first metatarsal and fifth metatarsal bilateral and sub right heel.  Musculoskeletal: MMT 5/5 bilateral lower extremities in DF, PF, Inversion and Eversion. Deceased ROM in DF of ankle joint.  Neurological: Sensation intact to light touch. Protective sensation diminished bilateral.    Assessment:   1. Pain due to onychomycosis of toenails of both feet   2. Controlled type 2 diabetes mellitus without complication, without long-term current use of insulin (HCC)   3. Callus       Plan:  Patient was evaluated and treated and all questions answered. -Discussed and educated patient on diabetic foot care, especially with  regards to the vascular, neurological and musculoskeletal systems.  -Stressed the importance of good glycemic control and the detriment of not  controlling glucose levels in relation to the foot. -Discussed supportive shoes at all times and checking feet regularly.  -Mechanically debrided all nails 1-5 bilateral using sterile nail nipper and filed with dremel without incident  -Hyperkeratotic tissue x 5  debrided without incident with chisel.  -Answered all patient questions -Patient to return  in 3 months for at risk foot care -Patient advised to call the office if any problems or questions arise in the meantime.   Jennefer Moats, DPM

## 2024-03-30 ENCOUNTER — Encounter: Payer: Self-pay | Admitting: Physician Assistant

## 2024-04-17 ENCOUNTER — Encounter: Payer: Self-pay | Admitting: Internal Medicine

## 2024-04-17 ENCOUNTER — Telehealth: Payer: Self-pay | Admitting: Internal Medicine

## 2024-04-17 NOTE — Telephone Encounter (Signed)
 Caller (Zina - patient's daughter-in-law, not on contact list) stated patient will be attending the visit with a caretaker and will not be able to complete forms.  Caller wants forms sent to son to be completed to email [email or web address], ph# [phone].

## 2024-04-17 NOTE — Telephone Encounter (Signed)
 Returned call to patient's son and they were wondering if any  forms need to be completed can they be emailed or filled out on mychart because patient has memory issues. They will not be able to bring her to the appointment. Her caretaker will be bringing her.

## 2024-04-23 ENCOUNTER — Ambulatory Visit: Payer: No Typology Code available for payment source | Attending: Internal Medicine | Admitting: Internal Medicine

## 2024-04-23 VITALS — BP 130/72 | HR 73 | Ht 66.0 in | Wt 146.8 lb

## 2024-04-23 DIAGNOSIS — Z7189 Other specified counseling: Secondary | ICD-10-CM

## 2024-04-23 DIAGNOSIS — E119 Type 2 diabetes mellitus without complications: Secondary | ICD-10-CM | POA: Diagnosis not present

## 2024-04-23 DIAGNOSIS — I700 Atherosclerosis of aorta: Secondary | ICD-10-CM | POA: Diagnosis not present

## 2024-04-23 DIAGNOSIS — I2510 Atherosclerotic heart disease of native coronary artery without angina pectoris: Secondary | ICD-10-CM | POA: Diagnosis not present

## 2024-04-23 MED ORDER — METOPROLOL TARTRATE 50 MG PO TABS
50.0000 mg | ORAL_TABLET | Freq: Once | ORAL | 0 refills | Status: DC
Start: 2024-04-23 — End: 2024-04-23

## 2024-04-23 MED ORDER — METOPROLOL TARTRATE 50 MG PO TABS: 50.0000 mg | ORAL_TABLET | Freq: Once | ORAL | 0 refills | Status: DC

## 2024-04-23 NOTE — Progress Notes (Unsigned)
 Cardiology Office Note:  .   Date:  04/23/2024  ID:  Anita Medina, DOB Jul 21, 1941, MRN [id number] PCP: Tena Feeling, MD  [facility] Providers Cardiologist:  None    History of Present Illness: .   Anita Medina is a 83 y.o. female.  Discussed the use of AI scribe software for clinical note transcription with the patient, who gave verbal consent to proceed.  History of Present Illness Anita Medina is an 83 year old female who presents for evaluation of calcium deposits in the aorta and coronary arteries. She is accompanied by Shelvy Dickens, her caregiver. Family member on phone.   Recent imaging studies show calcium deposits in the aorta and coronary arteries (minimal, ostial RCA). She experiences no chest discomfort, pain, tightness, or shortness of breath during daily activities or exercise however family worries that due to her dementia diagnosis, she may not be able to elaborate on these. She maintains an active lifestyle, including walking and attending a memory center.  There is a family history of heart disease. She denies chest pain, tightness, or shortness of breath during physical activities. Her family is concerned about her ability to accurately report symptoms due to memory issues, as she sometimes denies symptoms like intermittent swelling.    ROS: negative except per HPI above.  Studies Reviewed: Aaron Aas   EKG Interpretation Date/Time:  Monday Apr 23 2024 13:38:17 EDT Ventricular Rate:  73 PR Interval:  192 QRS Duration:  94 QT Interval:  412 QTC Calculation: 453 R Axis:   -9  Text Interpretation: Normal sinus rhythm Incomplete right bundle branch block Nonspecific T wave abnormality Confirmed by Grady Lawman ([id number]) on 04/23/2024 2:04:28 PM    Results RADIOLOGY Abdominal CT: Minimal calcification in aorta and coronary arteries   Risk Assessment/Calculations:       Physical Exam:   VS:  BP 130/72 (BP Location: Left Arm, Patient Position: Sitting, Cuff  Size: Normal)   Pulse 73   Ht 5\' 6"  (1.676 m)   Wt 146 lb 12.8 oz (66.6 kg)   LMP 04/15/1991   SpO2 94%   BMI 23.69 kg/m    Wt Readings from Last 3 Encounters:  04/23/24 146 lb 12.8 oz (66.6 kg)  07/15/23 131 lb (59.4 kg)  01/24/19 160 lb 8 oz (72.8 kg)     Physical Exam GENERAL: Alert, cooperative, well developed, no acute distress HEENT: Normocephalic, normal oropharynx, moist mucous membranes CHEST: Clear to auscultation bilaterally, no wheezes, rhonchi, or crackles CARDIOVASCULAR: Normal heart rate and rhythm, S1 and S2 normal without murmurs ABDOMEN: Soft, non-tender, non-distended, without organomegaly, normal bowel sounds EXTREMITIES: No cyanosis or edema NEUROLOGICAL: Cranial nerves grossly intact, moves all extremities without gross motor or sensory deficit   ASSESSMENT AND PLAN: .    Assessment and Plan Assessment & Plan Aortic and coronary artery calcification Calcium in aorta and coronary arteries without significant symptoms. CT scan chosen for lower radiation and to define anatomy. Further procedures based on symptom development. - Order CT scan of the heart with contrast to assess calcification and blood flow per family preference given inability for patient to report her symptoms reliably  Cardiac Risk counseling HLD - LDL 77, trig 86, hdl 53, well controlled for age.  - continue crestor 5 mg daily - carotid artery doppler for screening in setting of aortic atherosclerosis.   HTN - continue losartan  12.5 mg daily.   Dementia - on donepezil  10 mg daily.        Azeem Poorman  Chancy Comber, MD, FACC

## 2024-04-23 NOTE — Patient Instructions (Addendum)
 Medication Instructions:  No Changes *If you need a refill on your cardiac medications before your next appointment, please call your pharmacy*  Lab Work: BMET Today  If you have labs (blood work) drawn today and your tests are completely normal, you will receive your results only by: MyChart Message (if you have MyChart) OR A paper copy in the mail If you have any lab test that is abnormal or we need to change your treatment, we will call you to review the results.  Testing/Procedures: Your physician has requested that you have a carotid duplex. This test is an ultrasound of the carotid arteries in your neck. It looks at blood flow through these arteries that supply the brain with blood. Allow one hour for this exam. There are no restrictions or special instructions. This will take place at [address], 4th floor  Please note: We ask at that you not bring children with you during ultrasound (echo/ vascular) testing. Due to room size and safety concerns, children are not allowed in the ultrasound rooms during exams. Our front office staff cannot provide observation of children in our lobby area while testing is being conducted. An adult accompanying a patient to their appointment will only be allowed in the ultrasound room at the discretion of the ultrasound technician under special circumstances. We apologize for any inconvenience.     Your cardiac CT will be scheduled at one of the below locations:   [facility] [address] [place], [place] [address] [phone]   OR   [facility] and Vascular Tower [address]  [place], [place] [address] Opening April 02, 2024  If scheduled at [facility], please arrive at the [facility] and Children's Entrance (Entrance C2) of [facility] 30 minutes prior to test start time. You can use the FREE valet parking offered at entrance C (encouraged to control the heart rate for the test)  Proceed to  the [facility] Radiology Department (first floor) to check-in and test prep.   All radiology patients and guests should use entrance C2 at [facility], accessed from [facility], even though the hospital's physical address listed is [address]    If scheduled at the Heart and Vascular Tower at [address], please enter the parking lot using the [address] entrance and use the FREE valet service at the patient drop-off area. Enter the buidling and check-in with registration on the main floor.   Please follow these instructions carefully (unless otherwise directed):  An IV will be required for this test and Nitroglycerin will be given.   On the Night Before the Test: Be sure to Drink plenty of water. Do not consume any caffeinated/decaffeinated beverages or chocolate 12 hours prior to your test. Do not take any antihistamines 12 hours prior to your test.  On the Day of the Test: Drink plenty of water until 1 hour prior to the test. Do not eat any food 1 hour prior to test. You may take your regular medications prior to the test.  Take metoprolol  (Lopressor ) 50 mg two hours prior to test. If you take Furosemide/Hydrochlorothiazide/Spironolactone/Chlorthalidone, please HOLD on the morning of the test. Patients who wear a continuous glucose monitor MUST remove the device prior to scanning. FEMALES- please wear underwire-free bra if available, avoid dresses & tight clothing       After the Test: Drink plenty of water. After receiving IV contrast, you may experience a mild flushed  feeling. This is normal. On occasion, you may experience a mild rash up to 24 hours after the test. This is not dangerous. If this occurs, you can take Benadryl 25 mg, Zyrtec, Claritin, or Allegra and increase your fluid intake. (Patients taking Tikosyn should avoid Benadryl, and may take Zyrtec, Claritin, or Allegra) If you experience trouble breathing, this can be  serious. If it is severe call 911 IMMEDIATELY. If it is mild, please call our office.  We will call to schedule your test 2-4 weeks out understanding that some insurance companies will need an authorization prior to the service being performed.   For more information and frequently asked questions, please visit our website : [email or web address]  For non-scheduling related questions, please contact the cardiac imaging nurse navigator should you have any questions/concerns: Cardiac Imaging Nurse Navigators Direct Office Dial: [phone]   For scheduling needs, including cancellations and rescheduling, please call [place], [phone].   Follow-Up: At [facility], you and your health needs are our priority.  As part of our continuing mission to provide you with exceptional heart care, our providers are all part of one team.  This team includes your primary Cardiologist (physician) and Advanced Practice Providers or APPs (Physician Assistants and Nurse Practitioners) who all work together to provide you with the care you need, when you need it.  Your next appointment:   4 -6 week(s) (After Tests)  Provider:    Liane Redman, PA, or Ervin Heath, PA, or [place], [place], or Marlana Silvan, NP

## 2024-04-24 LAB — BASIC METABOLIC PANEL WITH GFR
BUN/Creatinine Ratio: 47 — ABNORMAL HIGH (ref 12–28)
BUN: 34 mg/dL — ABNORMAL HIGH (ref 8–27)
CO2: 17 mmol/L — ABNORMAL LOW (ref 20–29)
Calcium: 9.6 mg/dL (ref 8.7–10.3)
Chloride: 107 mmol/L — ABNORMAL HIGH (ref 96–106)
Creatinine, Ser: 0.72 mg/dL (ref 0.57–1.00)
Glucose: 103 mg/dL — ABNORMAL HIGH (ref 70–99)
Potassium: 3.7 mmol/L (ref 3.5–5.2)
Sodium: 143 mmol/L (ref 134–144)
eGFR: 83 mL/min/{1.73_m2} (ref >59)

## 2024-05-01 ENCOUNTER — Ambulatory Visit: Payer: Self-pay | Admitting: Internal Medicine

## 2024-05-10 ENCOUNTER — Ambulatory Visit (HOSPITAL_COMMUNITY)
Admission: RE | Admit: 2024-05-10 | Discharge: 2024-05-10 | Disposition: A | Discharge status: Home / Self Care | Source: Ambulatory Visit | Attending: Internal Medicine | Admitting: Internal Medicine

## 2024-05-10 DIAGNOSIS — I700 Atherosclerosis of aorta: Secondary | ICD-10-CM

## 2024-05-22 ENCOUNTER — Encounter (HOSPITAL_COMMUNITY): Payer: Self-pay

## 2024-05-23 ENCOUNTER — Telehealth (HOSPITAL_COMMUNITY): Payer: Self-pay | Admitting: *Deleted

## 2024-05-23 NOTE — Telephone Encounter (Signed)
 Reaching out to patient to offer assistance regarding upcoming cardiac imaging study; pt verbalizes understanding of appt date/time, parking situation and where to check in, pre-test NPO status and medications ordered, and verified current allergies; name and call back number provided for further questions should they arise Johney Frame RN Navigator Cardiac Imaging [facility] [phone] office [phone] cell

## 2024-05-24 ENCOUNTER — Ambulatory Visit (HOSPITAL_COMMUNITY)
Admission: RE | Admit: 2024-05-24 | Discharge: 2024-05-24 | Disposition: A | Discharge status: Home / Self Care | Source: Ambulatory Visit | Attending: Internal Medicine | Admitting: Internal Medicine

## 2024-05-24 DIAGNOSIS — R931 Abnormal findings on diagnostic imaging of heart and coronary circulation: Secondary | ICD-10-CM | POA: Diagnosis not present

## 2024-05-24 DIAGNOSIS — I2510 Atherosclerotic heart disease of native coronary artery without angina pectoris: Secondary | ICD-10-CM | POA: Insufficient documentation

## 2024-05-24 MED ORDER — NITROGLYCERIN 0.4 MG SL SUBL
0.8000 mg | SUBLINGUAL_TABLET | Freq: Once | SUBLINGUAL | Status: AC
  Administered 2024-05-24: 0.8 mg via SUBLINGUAL

## 2024-05-24 MED ORDER — METOPROLOL TARTRATE 5 MG/5ML IV SOLN: 10.0000 mg | Freq: Once | INTRAVENOUS | Status: DC | PRN

## 2024-05-24 MED ORDER — IOHEXOL 350 MG/ML SOLN
100.0000 mL | Freq: Once | INTRAVENOUS | Status: AC | PRN
  Administered 2024-05-24: 100 mL via INTRAVENOUS

## 2024-05-24 MED ORDER — DILTIAZEM HCL 25 MG/5ML IV SOLN: 10.0000 mg | INTRAVENOUS | Status: DC | PRN

## 2024-05-25 ENCOUNTER — Other Ambulatory Visit: Payer: Self-pay | Admitting: Internal Medicine

## 2024-05-25 ENCOUNTER — Ambulatory Visit (HOSPITAL_BASED_OUTPATIENT_CLINIC_OR_DEPARTMENT_OTHER)
Admission: RE | Admit: 2024-05-25 | Discharge: 2024-05-25 | Disposition: A | Discharge status: Home / Self Care | Source: Ambulatory Visit | Attending: Internal Medicine | Admitting: Internal Medicine

## 2024-05-25 DIAGNOSIS — I2510 Atherosclerotic heart disease of native coronary artery without angina pectoris: Secondary | ICD-10-CM | POA: Diagnosis not present

## 2024-05-25 DIAGNOSIS — R931 Abnormal findings on diagnostic imaging of heart and coronary circulation: Secondary | ICD-10-CM

## 2024-05-30 ENCOUNTER — Institutional Professional Consult (permissible substitution): Payer: Medicaid Other | Admitting: Psychology

## 2024-05-30 ENCOUNTER — Ambulatory Visit: Payer: Self-pay

## 2024-05-30 NOTE — Progress Notes (Deleted)
  Cardiology Office Note   Date:  05/30/2024  ID:  Lanell, Carpenter [birth year], MRN [id number] PCP: Dwight Trula SQUIBB, MD  [facility] Providers Cardiologist:  Soyla DELENA Merck, MD   History of Present Illness LEMMA TETRO is a 83 y.o. female with a past medical history of coronary artery calcifications, type 2 DM, HTN, HLD, GERD. Patient followed by Dr. Merck and presents today for follow up after coronary CTA  Patient was first seen by Dr. Merck on 04/23/24. She had recently had imaging that showed calcium deposits in the aorta and coronary arteries. She denied chest pain or shortness of breath. She did report a family history of hart disease. She underwent coronary CTA 05/24/24 that showed a coronary calcium score of 769 (91st percentile), total plaque volume 728 (69th percentile), moderate RCA. Study was sent for [facility] which showed no hemodynamically significant stenosis  Coronary atherosclerosis  - Coronary CTA from 05/2024 showed coronary calcium score of 769 (91st percentile), moderate stenosis in the RCA. Study was sent for [facility] which showed no hemodynamically significant stenosis  -  - Continue crestor 5 mg daily   HTN  -  - Continue losartan  12.5 mg daily, metoprolol  tartrate 50 mg BID  - K 3.7 and creatinine 0.72   HLD  - LDL 77. Well controlled for age  - Continue crestor 5 mg daily   ROS: ***  Studies Reviewed      *** Risk Assessment/Calculations {Does this patient have ATRIAL FIBRILLATION?:[phone]} No BP recorded.  {Refresh Note OR Click here to enter BP  :1}***       Physical Exam VS:  LMP 04/15/1991        Wt Readings from Last 3 Encounters:  04/23/24 146 lb 12.8 oz (66.6 kg)  07/15/23 131 lb (59.4 kg)  01/24/19 160 lb 8 oz (72.8 kg)    GEN: Well nourished, well developed in no acute distress NECK: No JVD; No carotid bruits CARDIAC: ***RRR, no murmurs, rubs, gallops RESPIRATORY:  Clear to auscultation without rales, wheezing or rhonchi   ABDOMEN: Soft, non-tender, non-distended EXTREMITIES:  No edema; No deformity   ASSESSMENT AND PLAN ***    {Are you ordering a CV Procedure (e.g. stress test, cath, DCCV, TEE, etc)?   Press F2        :[id number]}  Dispo: ***  Signed, Rollo FABIENE Louder, PA-C

## 2024-05-31 ENCOUNTER — Telehealth: Payer: Self-pay | Admitting: Internal Medicine

## 2024-05-31 NOTE — Telephone Encounter (Signed)
 Caller (Zina, daughter-in-law) stated patient's son wants to have a tele-visit on 7/8 instead of patient in-office visit and wants a call back to confirm.

## 2024-05-31 NOTE — Telephone Encounter (Signed)
 Spoke with zina, aware per dr loni, she would need to see the APP for a virtual visit. She reports the caretaker can bring her but her son can not be here. I ask if the caretaker can call him while in the room so he can hear and ask questions. She is going to check with the son and call back if need to change appointment.

## 2024-06-06 ENCOUNTER — Encounter: Payer: 59 | Admitting: Psychology

## 2024-06-07 ENCOUNTER — Ambulatory Visit: Payer: 59 | Admitting: Physician Assistant

## 2024-06-12 ENCOUNTER — Ambulatory Visit: Admitting: Cardiology

## 2024-06-13 ENCOUNTER — Ambulatory Visit: Payer: 59 | Admitting: Physician Assistant

## 2024-06-13 NOTE — Progress Notes (Addendum)
 Cardiology Office Note    Patient Name: Anita Medina Date of Encounter: 06/13/2024  Primary Care Provider:  Dwight Trula SQUIBB, MD Primary Cardiologist:  Anita DELENA Merck, MD Primary Electrophysiologist: None   Past Medical History    Past Medical History:  Diagnosis Date   Arthritis    Cognitive decline    Dementia (HCC)    Diverticulosis    DM (diabetes mellitus) (HCC)    Former smoker    Gait instability    GERD (gastroesophageal reflux disease)    Hypercholesterolemia    Osteoarthritis of right knee    Osteopenia    Protein calorie malnutrition (HCC)     History of Present Illness  Anita Medina is a 83 y.o. female with a PMH of coronary calcifications, aortic atherosclerosis, DM type II, HTN, HLD, GERD who presents today for follow-up of recent coronary CTA.  Anita Medina was seen initially by Anita Medina on 04/23/2024 after CT of the abdomen and pelvis showed coronary atherosclerosis and aortic atherosclerosis. She completed a coronary CTA 05/24/24 that showed a coronary calcium score of 769 (91st percentile), total plaque volume 728 (69th percentile), moderate RCA.  The study was sent for Anita Medina which showed no hemodynamically significant stenosis. Anita Medina recommended continuation of rosuvastatin with no further ischemic evaluation needed.  Anita Medina presents today with her caregiver and daughter via phone for follow-up.  She reports no acute changes or cardiac complaints since the prior visit. She uses a walker due to hip and knee discomfort, which limits her ability to exercise and increase her heart rate. Her blood pressure was noted to be 138/64, with occasional elevation of the systolic number, but she is not currently on antihypertensive medication. Her family history is significant for cardiovascular issues, including a son who passed away from heart disease, a grandson who had a heart transplant, and a son with a small thoracic aneurysm and mild atherosclerosis.  She has  recently started on cholesterol medication, and her cholesterol levels have normalized since starting Crestor. Her LDL was 77, triglycerides were within normal range, and total cholesterol was good. Patient denies chest pain, palpitations, dyspnea, PND, orthopnea, nausea, vomiting, dizziness, syncope, edema, weight gain, or early satiety.  Discussed the use of AI scribe software for clinical note transcription with the patient, who gave verbal consent to proceed.  History of Present Illness   Review of Systems  Please see the history of present illness.    All other systems reviewed and are otherwise negative except as noted above.  Physical Exam    Wt Readings from Last 3 Encounters:  04/23/24 146 lb 12.8 oz (66.6 kg)  07/15/23 131 lb (59.4 kg)  01/24/19 160 lb 8 oz (72.8 kg)   CD:Uyzmz were no vitals filed for this visit.,There is no height or weight on file to calculate BMI. GEN: Well nourished, well developed in no acute distress Neck: No JVD; No carotid bruits Pulmonary: Clear to auscultation without rales, wheezing or rhonchi  Cardiovascular: Normal rate. Regular rhythm. Normal S1. Normal S2.   Murmurs: There is no murmur.  ABDOMEN: Soft, non-tender, non-distended EXTREMITIES:  No edema; No deformity   EKG/LABS/ Recent Cardiac Studies   ECG personally reviewed by me today -none completed today  Risk Assessment/Calculations:          No results found for: WBC, HGB, HCT, MCV, PLT Lab Results  Component Value Date   CREATININE 0.72 04/23/2024   BUN 34 (H) 04/23/2024   NA 143 04/23/2024  K 3.7 04/23/2024   CL 107 (H) 04/23/2024   CO2 17 (L) 04/23/2024   No results found for: CHOL, HDL, LDLCALC, LDLDIRECT, TRIG, CHOLHDL  Lab Results  Component Value Date   HGBA1C 6.5 01/24/2019   Assessment & Plan    Assessment and Plan Assessment & Plan   1.Coronary atherosclerosis  - Coronary CTA from 05/2024 showed coronary calcium score of 769 (91st  percentile), moderate stenosis in the RCA. Study was sent for Anita Medina which showed no hemodynamically significant stenosis  -Patient reports no chest pain or angina since previous visit - Continue crestor 5 mg daily  - Continue lifestyle modifications with primary prevention - Recommend chair exercises or chair yoga. - Discuss cholesterol levels with primary care physician during upcoming physical.  2.HTN  -Patient's blood pressure today was stable at 138/64 - Continue losartan  12.5 mg daily, metoprolol  tartrate 50 mg BID  - K 3.7 and creatinine 0.72   3. HLD  - LDL 77. Well controlled for age  - Continue crestor 5 mg daily   4.  Aortic atherosclerosis: - Noted on most recent coronary CTA with advisement to continue statin therapy - Primary prevention discussed and advised   Disposition: Follow-up with Anita DELENA Merck, MD or APP in 6 months    Signed, Wyn Raddle, Jackee Shove, NP 06/13/2024, 7:21 PM [facility]

## 2024-06-14 ENCOUNTER — Encounter: Payer: Self-pay | Admitting: Nurse Practitioner

## 2024-06-14 ENCOUNTER — Ambulatory Visit: Attending: Nurse Practitioner | Admitting: Nurse Practitioner

## 2024-06-14 VITALS — BP 138/64 | HR 70 | Ht 66.0 in | Wt 151.2 lb

## 2024-06-14 DIAGNOSIS — I10 Essential (primary) hypertension: Secondary | ICD-10-CM | POA: Diagnosis not present

## 2024-06-14 DIAGNOSIS — I2510 Atherosclerotic heart disease of native coronary artery without angina pectoris: Secondary | ICD-10-CM

## 2024-06-14 DIAGNOSIS — I700 Atherosclerosis of aorta: Secondary | ICD-10-CM | POA: Diagnosis not present

## 2024-06-14 DIAGNOSIS — E119 Type 2 diabetes mellitus without complications: Secondary | ICD-10-CM | POA: Diagnosis not present

## 2024-06-14 NOTE — Patient Instructions (Signed)
 Medication Instructions:  Your physician recommends that you continue on your current medications as directed. Please refer to the Current Medication list given to you today.  *If you need a refill on your cardiac medications before your next appointment, please call your pharmacy*  Lab Work: None Ordered If you have labs (blood work) drawn today and your tests are completely normal, you will receive your results only by: MyChart Message (if you have MyChart) OR A paper copy in the mail If you have any lab test that is abnormal or we need to change your treatment, we will call you to review the results.  Testing/Procedures: None Ordered  Follow-Up: At [facility], you and your health needs are our priority.  As part of our continuing mission to provide you with exceptional heart care, our providers are all part of one team.  This team includes your primary Cardiologist (physician) and Advanced Practice Providers or APPs (Physician Assistants and Nurse Practitioners) who all work together to provide you with the care you need, when you need it.  Your next appointment:   6 month(s)  Provider:   Gayatri A Acharya, MD    We recommend signing up for the patient portal called MyChart.  Sign up information is provided on this After Visit Summary.  MyChart is used to connect with patients for Virtual Visits (Telemedicine).  Patients are able to view lab/test results, encounter notes, upcoming appointments, etc.  Non-urgent messages can be sent to your provider as well.   To learn more about what you can do with MyChart, go to [email or web address].   Other Instructions

## 2024-06-21 ENCOUNTER — Ambulatory Visit
Admission: RE | Admit: 2024-06-21 | Discharge: 2024-06-21 | Disposition: A | Discharge status: Home / Self Care | Source: Ambulatory Visit | Attending: Internal Medicine | Admitting: Internal Medicine

## 2024-06-21 ENCOUNTER — Ambulatory Visit

## 2024-06-21 DIAGNOSIS — I2510 Atherosclerotic heart disease of native coronary artery without angina pectoris: Secondary | ICD-10-CM | POA: Diagnosis not present

## 2024-06-21 DIAGNOSIS — Z8673 Personal history of transient ischemic attack (TIA), and cerebral infarction without residual deficits: Secondary | ICD-10-CM | POA: Diagnosis not present

## 2024-06-21 DIAGNOSIS — Z1231 Encounter for screening mammogram for malignant neoplasm of breast: Secondary | ICD-10-CM

## 2024-06-21 DIAGNOSIS — R7303 Prediabetes: Secondary | ICD-10-CM | POA: Diagnosis not present

## 2024-06-21 DIAGNOSIS — E7800 Pure hypercholesterolemia, unspecified: Secondary | ICD-10-CM | POA: Diagnosis not present

## 2024-06-21 DIAGNOSIS — Z0000 Encounter for general adult medical examination without abnormal findings: Secondary | ICD-10-CM | POA: Diagnosis not present

## 2024-06-21 DIAGNOSIS — Z79899 Other long term (current) drug therapy: Secondary | ICD-10-CM | POA: Diagnosis not present

## 2024-06-21 DIAGNOSIS — R3129 Other microscopic hematuria: Secondary | ICD-10-CM | POA: Diagnosis not present

## 2024-06-21 DIAGNOSIS — M1711 Unilateral primary osteoarthritis, right knee: Secondary | ICD-10-CM | POA: Diagnosis not present

## 2024-06-25 ENCOUNTER — Ambulatory Visit (INDEPENDENT_AMBULATORY_CARE_PROVIDER_SITE_OTHER): Admitting: Podiatry

## 2024-06-25 ENCOUNTER — Encounter: Payer: Self-pay | Admitting: Podiatry

## 2024-06-25 DIAGNOSIS — B351 Tinea unguium: Secondary | ICD-10-CM

## 2024-06-25 DIAGNOSIS — E119 Type 2 diabetes mellitus without complications: Secondary | ICD-10-CM

## 2024-06-25 DIAGNOSIS — M79674 Pain in right toe(s): Secondary | ICD-10-CM

## 2024-06-25 DIAGNOSIS — M79675 Pain in left toe(s): Secondary | ICD-10-CM | POA: Diagnosis not present

## 2024-06-25 DIAGNOSIS — L84 Corns and callosities: Secondary | ICD-10-CM

## 2024-06-25 NOTE — Progress Notes (Signed)
  Subjective:  Patient ID: Anita Medina, female    DOB: 1941-03-20,   MRN: [id number]  Chief Complaint  Patient presents with   Diabetes    Clip her toenails.  She was recently diagnosed with Diabeters.  Her daughter wants to know why her toenails curl.  Saw Dr Dwight -     83 y.o. female presents for concern of thickened elongated and painful nails that are difficult to trim. Requesting to have them trimmed today. Relates burning and tingling in their feet. Patient is diabetic and last A1c was  Lab Results  Component Value Date   HGBA1C 6.5 01/24/2019   .   PCP:  Dwight Trula SQUIBB, MD    . Denies any other pedal complaints. Denies n/v/f/c.   Past Medical History:  Diagnosis Date   Arthritis    Cognitive decline    Dementia (HCC)    Diverticulosis    DM (diabetes mellitus) (HCC)    Former smoker    Gait instability    GERD (gastroesophageal reflux disease)    Hypercholesterolemia    Osteoarthritis of right knee    Osteopenia    Protein calorie malnutrition (HCC)     Objective:  Physical Exam: Vascular: DP/PT pulses 2/4 bilateral. CFT <3 seconds. Absent hair growth on digits. Edema noted to bilateral lower extremities. Xerosis noted bilaterally.  Skin. No lacerations or abrasions bilateral feet. Nails 1-5 bilateral  are thickened discolored and elongated with subungual debris. Hyperkeratotic lesions noted sub first metatarsal and fifth metatarsal bilateral and sub right heel.  Musculoskeletal: MMT 5/5 bilateral lower extremities in DF, PF, Inversion and Eversion. Deceased ROM in DF of ankle joint.  Neurological: Sensation intact to light touch. Protective sensation diminished bilateral.    Assessment:   1. Pain due to onychomycosis of toenails of both feet   2. Controlled type 2 diabetes mellitus without complication, without long-term current use of insulin (HCC)   3. Callus       Plan:  Patient was evaluated and treated and all questions answered. -Discussed and  educated patient on diabetic foot care, especially with  regards to the vascular, neurological and musculoskeletal systems.  -Stressed the importance of good glycemic control and the detriment of not  controlling glucose levels in relation to the foot. -Discussed supportive shoes at all times and checking feet regularly.  -Mechanically debrided all nails 1-5 bilateral using sterile nail nipper and filed with dremel without incident  -Hyperkeratotic tissue x 5 debrided without incident with chisel.  -Answered all patient questions -Patient to return  in 3 months for at risk foot care -Patient advised to call the office if any problems or questions arise in the meantime.   Asberry Failing, DPM

## 2024-06-26 ENCOUNTER — Ambulatory Visit

## 2024-07-20 ENCOUNTER — Ambulatory Visit: Admitting: Physician Assistant

## 2024-08-16 DIAGNOSIS — R8271 Bacteriuria: Secondary | ICD-10-CM | POA: Diagnosis not present

## 2024-08-16 DIAGNOSIS — R3121 Asymptomatic microscopic hematuria: Secondary | ICD-10-CM | POA: Diagnosis not present

## 2024-09-24 NOTE — Progress Notes (Signed)
 HOLLEE FATE                                          MRN: [id number]   09/24/2024   The VBCI Quality Team Specialist reviewed this patient medical record for the purposes of chart review for care gap closure. The following were reviewed: chart review for care gap closure-kidney health evaluation for diabetes:uACR.    VBCI Quality Team

## 2024-09-25 ENCOUNTER — Encounter: Payer: Self-pay | Admitting: Podiatry

## 2024-09-25 ENCOUNTER — Ambulatory Visit: Admitting: Podiatry

## 2024-09-25 DIAGNOSIS — B351 Tinea unguium: Secondary | ICD-10-CM | POA: Diagnosis not present

## 2024-09-25 DIAGNOSIS — M79674 Pain in right toe(s): Secondary | ICD-10-CM

## 2024-09-25 DIAGNOSIS — L84 Corns and callosities: Secondary | ICD-10-CM | POA: Diagnosis not present

## 2024-09-25 DIAGNOSIS — M79675 Pain in left toe(s): Secondary | ICD-10-CM | POA: Diagnosis not present

## 2024-09-25 DIAGNOSIS — E119 Type 2 diabetes mellitus without complications: Secondary | ICD-10-CM | POA: Diagnosis not present

## 2024-09-25 NOTE — Progress Notes (Signed)
  Subjective:  Patient ID: Anita Medina, female    DOB: Jun 10, 1941,   MRN: [id number]  Chief Complaint  Patient presents with   Diabetes    83 y.o. female presents for concern of thickened elongated and painful nails that are difficult to trim. Requesting to have them trimmed today. Relates burning and tingling in their feet. Patient is diabetic and last A1c was  Lab Results  Component Value Date   HGBA1C 6.5 01/24/2019   .   PCP:  Dwight Trula SQUIBB, MD    . Denies any other pedal complaints. Denies n/v/f/c.   Past Medical History:  Diagnosis Date   Arthritis    Cognitive decline    Dementia (HCC)    Diverticulosis    DM (diabetes mellitus) (HCC)    Former smoker    Gait instability    GERD (gastroesophageal reflux disease)    Hypercholesterolemia    Osteoarthritis of right knee    Osteopenia    Protein calorie malnutrition     Objective:  Physical Exam: Vascular: DP/PT pulses 2/4 bilateral. CFT <3 seconds. Absent hair growth on digits. Edema noted to bilateral lower extremities. Xerosis noted bilaterally.  Skin. No lacerations or abrasions bilateral feet. Nails 1-5 bilateral  are thickened discolored and elongated with subungual debris. Hyperkeratotic lesions noted sub first metatarsal and fifth metatarsal bilateral and sub right heel.  Musculoskeletal: MMT 5/5 bilateral lower extremities in DF, PF, Inversion and Eversion. Deceased ROM in DF of ankle joint.  Neurological: Sensation intact to light touch. Protective sensation diminished bilateral.    Assessment:   1. Pain due to onychomycosis of toenails of both feet   2. Controlled type 2 diabetes mellitus without complication, without long-term current use of insulin (HCC)       Plan:  Patient was evaluated and treated and all questions answered. -Discussed and educated patient on diabetic foot care, especially with  regards to the vascular, neurological and musculoskeletal systems.  -Stressed the importance of  good glycemic control and the detriment of not  controlling glucose levels in relation to the foot. -Discussed supportive shoes at all times and checking feet regularly.  -Mechanically debrided all nails 1-5 bilateral using sterile nail nipper and filed with dremel without incident  -Hyperkeratotic tissue x 5 debrided without incident with chisel.  -Answered all patient questions -Patient to return  in 3 months for at risk foot care -Patient advised to call the office if any problems or questions arise in the meantime.   Asberry Failing, DPM

## 2024-11-23 ENCOUNTER — Encounter: Payer: Self-pay | Admitting: Internal Medicine

## 2024-12-31 ENCOUNTER — Ambulatory Visit: Admitting: Internal Medicine

## 2025-01-10 ENCOUNTER — Other Ambulatory Visit: Payer: Self-pay

## 2025-01-10 ENCOUNTER — Telehealth: Payer: Self-pay

## 2025-01-10 DIAGNOSIS — R413 Other amnesia: Secondary | ICD-10-CM

## 2025-01-10 NOTE — Telephone Encounter (Signed)
-----   Message from Camie Sevin, PA-C sent at 01/10/2025 11:11 AM EST ----- Regarding: FW: Plasma Biomarkers Hey this is the pt that needs biomarkers prior to seeing  Dr. Gayland , thanks ----- Message ----- From: Gayland Hedge Sent: 01/10/2025   9:31 AM EST To: Camie FORBES Sevin, PA-C Subject: Plasma Biomarkers                              This is the patient we talked about earlier for ordering the blood test. Thanks!

## 2025-01-10 NOTE — Telephone Encounter (Signed)
 Spoke to [employer], he will bring her by for the biomarkers soon . Thanked me for calling.

## 2025-01-11 ENCOUNTER — Telehealth: Payer: Self-pay | Admitting: Physician Assistant

## 2025-01-11 NOTE — Telephone Encounter (Signed)
 Patient is to have Blood work and they need to know if she needs to fast for this or not. Also they sent a mychart message to [facility] about some test that she talked to them about and they want to know if this needs to be done before they see her in March. It is a CSF study and Amyloid imaging. Also they are wanting to see if they can be on speaker phone at the first of the appt with Dr Gayland since they live out of town. He rcare taker will be with her.  They were not sure if they needed to fly down to [place] to be at the appt.

## 2025-01-11 NOTE — Telephone Encounter (Signed)
 I advised blood markers are to be done first per Camie Dina RIGGERS. He thanked me for callling.

## 2025-01-15 ENCOUNTER — Ambulatory Visit: Admitting: Podiatry

## 2025-02-12 ENCOUNTER — Ambulatory Visit: Admitting: Internal Medicine

## 2025-03-04 ENCOUNTER — Ambulatory Visit: Payer: Self-pay

## 2025-03-04 ENCOUNTER — Institutional Professional Consult (permissible substitution): Payer: Self-pay | Admitting: Psychology

## 2025-03-12 ENCOUNTER — Encounter: Payer: Self-pay | Admitting: Psychology
